# Patient Record
Sex: Female | Born: 1935
Health system: Southern US, Community
[De-identification: ages and names within clinical notes are randomized; demographics above are authoritative.]

## PROBLEM LIST (undated history)

## (undated) DIAGNOSIS — R42 Dizziness and giddiness: Secondary | ICD-10-CM

## (undated) DIAGNOSIS — C801 Malignant (primary) neoplasm, unspecified: Secondary | ICD-10-CM

## (undated) HISTORY — PX: SKIN BIOPSY: SHX1

## (undated) HISTORY — PX: BREAST BIOPSY: SHX20

---

## 2004-01-02 ENCOUNTER — Ambulatory Visit: Payer: Self-pay | Admitting: Internal Medicine

## 2005-02-16 ENCOUNTER — Ambulatory Visit: Payer: Self-pay | Admitting: Internal Medicine

## 2006-03-01 ENCOUNTER — Ambulatory Visit: Payer: Self-pay | Admitting: Internal Medicine

## 2006-03-04 ENCOUNTER — Ambulatory Visit: Payer: Self-pay | Admitting: Internal Medicine

## 2007-03-14 ENCOUNTER — Ambulatory Visit: Payer: Self-pay | Admitting: Internal Medicine

## 2008-04-26 ENCOUNTER — Ambulatory Visit: Payer: Self-pay | Admitting: Internal Medicine

## 2009-04-06 ENCOUNTER — Ambulatory Visit: Payer: Self-pay | Admitting: Otolaryngology

## 2009-04-29 ENCOUNTER — Ambulatory Visit: Payer: Self-pay | Admitting: Internal Medicine

## 2010-05-22 ENCOUNTER — Ambulatory Visit: Payer: Self-pay | Admitting: Internal Medicine

## 2010-12-06 ENCOUNTER — Ambulatory Visit: Payer: Self-pay | Admitting: Internal Medicine

## 2011-03-24 DIAGNOSIS — IMO0002 Reserved for concepts with insufficient information to code with codable children: Secondary | ICD-10-CM | POA: Insufficient documentation

## 2011-07-24 ENCOUNTER — Ambulatory Visit: Payer: Self-pay | Admitting: Internal Medicine

## 2012-08-31 ENCOUNTER — Ambulatory Visit: Payer: Self-pay | Admitting: Internal Medicine

## 2013-09-21 DIAGNOSIS — K279 Peptic ulcer, site unspecified, unspecified as acute or chronic, without hemorrhage or perforation: Secondary | ICD-10-CM | POA: Insufficient documentation

## 2013-09-21 DIAGNOSIS — K219 Gastro-esophageal reflux disease without esophagitis: Secondary | ICD-10-CM | POA: Insufficient documentation

## 2013-09-21 DIAGNOSIS — E785 Hyperlipidemia, unspecified: Secondary | ICD-10-CM | POA: Insufficient documentation

## 2013-09-29 ENCOUNTER — Ambulatory Visit: Payer: Self-pay | Admitting: Internal Medicine

## 2013-12-01 ENCOUNTER — Ambulatory Visit: Payer: Self-pay | Admitting: Physician Assistant

## 2013-12-06 ENCOUNTER — Encounter: Payer: Self-pay | Admitting: Surgery

## 2013-12-26 ENCOUNTER — Encounter: Payer: Self-pay | Admitting: Surgery

## 2013-12-26 LAB — WOUND CULTURE

## 2013-12-29 ENCOUNTER — Encounter: Payer: Self-pay | Admitting: General Surgery

## 2015-05-09 ENCOUNTER — Ambulatory Visit (INDEPENDENT_AMBULATORY_CARE_PROVIDER_SITE_OTHER): Payer: Medicare Other

## 2015-05-09 ENCOUNTER — Encounter: Payer: Self-pay | Admitting: *Deleted

## 2015-05-09 ENCOUNTER — Ambulatory Visit
Admission: EM | Admit: 2015-05-09 | Discharge: 2015-05-09 | Disposition: A | Discharge status: Home / Self Care | Payer: Medicare Other | Attending: Family Medicine | Admitting: Family Medicine

## 2015-05-09 DIAGNOSIS — S62609B Fracture of unspecified phalanx of unspecified finger, initial encounter for open fracture: Secondary | ICD-10-CM | POA: Diagnosis not present

## 2015-05-09 HISTORY — DX: Malignant (primary) neoplasm, unspecified: C80.1

## 2015-05-09 HISTORY — DX: Dizziness and giddiness: R42

## 2015-05-09 MED ORDER — SULFAMETHOXAZOLE-TRIMETHOPRIM 800-160 MG PO TABS: 1.0000 | ORAL_TABLET | Freq: Two times a day (BID) | ORAL | Status: DC

## 2015-05-09 MED ORDER — MUPIROCIN 2 % EX OINT: 1.0000 "application " | TOPICAL_OINTMENT | Freq: Three times a day (TID) | CUTANEOUS | Status: DC

## 2015-05-09 NOTE — ED Notes (Signed)
Patient was bringing in a trash can and fell in the driveway injuring her right thumb and bumping the left side of her face. Patient did experience nausea for a few minutes, but it went away. Patient does not have a history of falls.

## 2015-05-09 NOTE — Discharge Instructions (Signed)
Finger Fracture  Fractures of fingers are breaks in the bones of the fingers. There are many types of fractures. There are different ways of treating these fractures. Your health care provider will discuss the best way to treat your fracture.  CAUSES  Traumatic injury is the main cause of broken fingers. These include:  · Injuries while playing sports.  · Workplace injuries.  · Falls.  RISK FACTORS  Activities that can increase your risk of finger fractures include:  · Sports.  · Workplace activities that involve machinery.  · A condition called osteoporosis, which can make your bones less dense and cause them to fracture more easily.  SIGNS AND SYMPTOMS  The main symptoms of a broken finger are pain and swelling within 15 minutes after the injury. Other symptoms include:  · Bruising of your finger.  · Stiffness of your finger.  · Numbness of your finger.  · Exposed bones (compound fracture) if the fracture is severe.  DIAGNOSIS   The best way to diagnose a broken bone is with X-ray imaging. Additionally, your health care provider will use this X-ray image to evaluate the position of the broken finger bones.   TREATMENT   Finger fractures can be treated with:   · Nonreduction--This means the bones are in place. The finger is splinted without changing the positions of the bone pieces. The splint is usually left on for about a week to 10 days. This will depend on your fracture and what your health care provider thinks.  · Closed reduction--The bones are put back into position without using surgery. The finger is then splinted.  · Open reduction and internal fixation--The fracture site is opened. Then the bone pieces are fixed into place with pins or some type of hardware. This is seldom required. It depends on the severity of the fracture.  HOME CARE INSTRUCTIONS   · Follow your health care provider's instructions regarding activities, exercises, and physical therapy.  · Only take over-the-counter or prescription  medicines for pain, discomfort, or fever as directed by your health care provider.  SEEK MEDICAL CARE IF:  You have pain or swelling that limits the motion or use of your fingers.  SEEK IMMEDIATE MEDICAL CARE IF:   Your finger becomes numb.  MAKE SURE YOU:   · Understand these instructions.  · Will watch your condition.  · Will get help right away if you are not doing well or get worse.     This information is not intended to replace advice given to you by your health care provider. Make sure you discuss any questions you have with your health care provider.     Document Released: 04/26/2000 Document Revised: 11/02/2012 Document Reviewed: 08/24/2012  Elsevier Interactive Patient Education ©2016 [employer]

## 2015-05-09 NOTE — ED Provider Notes (Signed)
CSN: [id number]     Arrival date & time 05/09/15  1148 History   First MD Initiated Contact with Patient 05/09/15 1325     Chief Complaint  Patient presents with  . Finger Injury  . Facial Injury   (Consider location/radiation/quality/duration/timing/severity/associated sxs/prior Treatment) HPI  82 year old female who presents with an injury to her right thumb that occurred today when she was bringing in a trash can loss her balance and fell on a concrete pavement. Injured her right thumb and pain to left side of her face. She did not have any loss of consciousness and remembers the fall. She has not had any confusion or change in mental status according to her daughter. Does however have bruising of her right thumb swelling and tenderness particularly over the IP joint. There is a abrasion over the IP joint which extends into the dermis. Motion the IP joint is very painful.    Past Medical History  Diagnosis Date  . Vertigo   . Cancer [facility])     skin cancer on left cheek   Past Surgical History  Procedure Laterality Date  . Skin biopsy     History reviewed. No pertinent family history. Social History  Substance Use Topics  . Smoking status: Never Smoker   . Smokeless tobacco: Never Used  . Alcohol Use: No   OB History    No data available     Review of Systems  Constitutional: Positive for activity change. Negative for fever and chills.  Skin: Positive for color change and wound.  All other systems reviewed and are negative.   Allergies  Review of patient's allergies indicates no known allergies.  Home Medications   Prior to Admission medications   Medication Sig Start Date End Date Taking? Authorizing Provider  mupirocin ointment (BACTROBAN) 2 % Apply 1 application topically 3 (three) times daily. 05/09/15   Lorin Picket, PA-C  sulfamethoxazole-trimethoprim (BACTRIM DS,SEPTRA DS) 800-160 MG tablet Take 1 tablet by mouth 2 (two) times daily. 05/09/15   Lorin Picket, PA-C   Meds Ordered and Administered this Visit  Medications - No data to display  BP 154/80 mmHg  Pulse 73  Temp(Src) 97.8 F (36.6 C) (Oral)  Resp 18  Ht 5\' 2"  (1.575 m)  Wt 155 lb (70.308 kg)  BMI 28.34 kg/m2  SpO2 98% No data found.   Physical Exam  Constitutional: She appears well-developed and well-nourished. No distress.  HENT:  Head: Normocephalic and atraumatic.  Eyes: Conjunctivae are normal. Pupils are equal, round, and reactive to light.  Neck: Normal range of motion. Neck supple.  Musculoskeletal: She exhibits edema and tenderness.  Examination of the right dominant thumb shows a abrasion over the dorsum of the IP joint which extends into the dermis. There is swelling of the thumb with ecchymosis into the base of the thumb in the web space. There is tenderness over the IP joint and also of the distal proximal phalanx.  Examination of the face shows a small abrasion over the corner of her left eyebrow which is very small 1-1/2 mm in diameter. Is no tenderness of the facial bones of her bones malar bones.EOM are full and intact. Does not appear to be any globe injury.  Neurological: She is alert. She displays normal reflexes. No cranial nerve deficit. She exhibits normal muscle tone. Coordination normal.  Skin: Skin is warm and dry. She is not diaphoretic. There is erythema.  Psychiatric: She has a normal mood and affect. Her behavior  is normal. Judgment and thought content normal.  Nursing note and vitals reviewed.   ED Course  Procedures (including critical care time)  Labs Review Labs Reviewed - No data to display  Imaging Review Dg Finger Thumb Right  05/09/2015  CLINICAL DATA:  Right fell pain after fall.  Initial encounter. EXAM: RIGHT THUMB 2+V COMPARISON:  None. FINDINGS: Minimally displaced fracture is seen involving the first proximal phalanx. This appears to be closed and posttraumatic. Severe narrowing and osteophyte formation is seen involving  the first carpometacarpal joint. IMPRESSION: Minimally displaced first proximal phalangeal fracture. Osteoarthritis of the first carpometacarpal joint. Electronically Signed   By: Marijo Conception, M.D.   On: 05/09/2015 15:02     Visual Acuity Review  Right Eye Distance:   Left Eye Distance:   Bilateral Distance:    Right Eye Near:   Left Eye Near:    Bilateral Near:     Wound on the thumb was cleansed and dressed using a nonocclusive dressing and triple antibiotic. A dry sterile dressing was then applied. A radial gutter splint was also applied.    MDM   1. Open fracture of phalanx or phalanges of hand, initial encounter    Discharge Medication List as of 05/09/2015  3:54 PM    START taking these medications   Details  mupirocin ointment (BACTROBAN) 2 % Apply 1 application topically 3 (three) times daily., Starting 05/09/2015, Until Discontinued, Normal    sulfamethoxazole-trimethoprim (BACTRIM DS,SEPTRA DS) 800-160 MG tablet Take 1 tablet by mouth 2 (two) times daily., Starting 05/09/2015, Until Discontinued, Normal      Plan: 1. Test/x-ray results and diagnosis reviewed with patient 2. rx as per orders; risks, benefits, potential side effects reviewed with patient 3. Recommend supportive treatment with Ice and elevation. Is a possibility of an open fracture I would start her on Septra and wanted her to be seen by hand surgeon tomorrow or early next week. She may remove the radial gutter splint care but should not be aggressive in the use of her thumb.If she does remove the radial gutter splint she will clean the wound and apply Bactroban ointment S and reapply the splint. She notices any red streaks going up her arm has fever or is not feeling well she should go to emergency room. Also asked her daughter that  If she noticed any change in her personality or mental status that she should be seen immediately as well. They're going to make an appointment at [facility] with the hand  surgeon. 4. F/u prn if symptoms worsen or don't improve     Lorin Picket, PA-C 05/09/15 1545  Lorin Picket, PA-C 05/09/15 1601

## 2015-09-06 ENCOUNTER — Other Ambulatory Visit: Payer: Self-pay | Admitting: Internal Medicine

## 2015-09-06 DIAGNOSIS — Z1231 Encounter for screening mammogram for malignant neoplasm of breast: Secondary | ICD-10-CM

## 2015-09-13 ENCOUNTER — Ambulatory Visit
Admission: RE | Admit: 2015-09-13 | Discharge: 2015-09-13 | Disposition: A | Discharge status: Home / Self Care | Payer: Medicare Other | Source: Ambulatory Visit | Attending: Internal Medicine | Admitting: Internal Medicine

## 2015-09-13 ENCOUNTER — Other Ambulatory Visit: Payer: Self-pay | Admitting: Internal Medicine

## 2015-09-13 DIAGNOSIS — Z1231 Encounter for screening mammogram for malignant neoplasm of breast: Secondary | ICD-10-CM | POA: Diagnosis not present

## 2016-08-17 ENCOUNTER — Other Ambulatory Visit: Payer: Self-pay | Admitting: Internal Medicine

## 2016-08-25 ENCOUNTER — Other Ambulatory Visit: Payer: Self-pay | Admitting: Internal Medicine

## 2016-08-25 DIAGNOSIS — Z1231 Encounter for screening mammogram for malignant neoplasm of breast: Secondary | ICD-10-CM

## 2016-08-26 DIAGNOSIS — E2839 Other primary ovarian failure: Secondary | ICD-10-CM | POA: Insufficient documentation

## 2016-09-11 ENCOUNTER — Ambulatory Visit
Admission: RE | Admit: 2016-09-11 | Discharge: 2016-09-11 | Disposition: A | Discharge status: Home / Self Care | Payer: Medicare Other | Source: Ambulatory Visit | Attending: Internal Medicine | Admitting: Internal Medicine

## 2016-09-11 DIAGNOSIS — Z1231 Encounter for screening mammogram for malignant neoplasm of breast: Secondary | ICD-10-CM

## 2016-09-14 ENCOUNTER — Ambulatory Visit
Admission: RE | Admit: 2016-09-14 | Discharge: 2016-09-14 | Disposition: A | Discharge status: Home / Self Care | Payer: Medicare Other | Source: Ambulatory Visit | Attending: Internal Medicine | Admitting: Internal Medicine

## 2016-09-14 DIAGNOSIS — Z1231 Encounter for screening mammogram for malignant neoplasm of breast: Secondary | ICD-10-CM | POA: Insufficient documentation

## 2017-08-30 ENCOUNTER — Other Ambulatory Visit: Payer: Self-pay

## 2017-08-30 ENCOUNTER — Encounter: Payer: Self-pay | Admitting: Emergency Medicine

## 2017-08-30 ENCOUNTER — Ambulatory Visit (INDEPENDENT_AMBULATORY_CARE_PROVIDER_SITE_OTHER)
Admit: 2017-08-30 | Discharge: 2017-08-30 | Disposition: A | Discharge status: Home / Self Care | Payer: Medicare Other | Attending: Family Medicine | Admitting: Family Medicine

## 2017-08-30 ENCOUNTER — Ambulatory Visit
Admission: EM | Admit: 2017-08-30 | Discharge: 2017-08-30 | Disposition: A | Discharge status: Home / Self Care | Payer: Medicare Other | Attending: Family Medicine | Admitting: Family Medicine

## 2017-08-30 DIAGNOSIS — M79662 Pain in left lower leg: Secondary | ICD-10-CM | POA: Diagnosis not present

## 2017-08-30 DIAGNOSIS — L03116 Cellulitis of left lower limb: Secondary | ICD-10-CM | POA: Diagnosis not present

## 2017-08-30 DIAGNOSIS — S81802D Unspecified open wound, left lower leg, subsequent encounter: Secondary | ICD-10-CM | POA: Diagnosis not present

## 2017-08-30 DIAGNOSIS — Z23 Encounter for immunization: Secondary | ICD-10-CM

## 2017-08-30 MED ORDER — CEPHALEXIN 500 MG PO CAPS: 500.0000 mg | ORAL_CAPSULE | Freq: Four times a day (QID) | ORAL | 0 refills | Status: AC

## 2017-08-30 MED ORDER — TETANUS-DIPHTH-ACELL PERTUSSIS 5-2.5-18.5 LF-MCG/0.5 IM SUSP
0.5000 mL | Freq: Once | INTRAMUSCULAR | Status: AC
  Administered 2017-08-30: 0.5 mL via INTRAMUSCULAR

## 2017-08-30 MED ORDER — MUPIROCIN 2 % EX OINT: TOPICAL_OINTMENT | CUTANEOUS | 0 refills | Status: DC

## 2017-08-30 NOTE — ED Triage Notes (Signed)
Patient states that she hit her left lower leg on a wire basket about a week ago.  Patient was seen at Urgent Care in [place] on Monday.  Patient here for follow-up regarding her skin tear.

## 2017-08-30 NOTE — ED Provider Notes (Signed)
[facility] ____________________________________________  Time seen: Approximately 9:50 AM  I have reviewed the triage vital signs and the nursing notes.   HISTORY  Chief Complaint Leg Pain (left)   HPI Brooke Moyer is a 84 y.o. female presenting with daughter at bedside for evaluation of left leg wound that occurred just over 1 week ago.  Reports last Sunday she was walking to the house and excellently hit the end of a week or basket which caused a skin tear to left leg.  States that she went to a urgent care in [place] last Monday, and states that that time they clean the area but otherwise no intervention occurred.  States was not placed on oral antibiotics.  States over the last week the area has seemed to become somewhat red around the area as well as with some tenderness.  States initially after the injury she denies pain.  Has continued to remain ambulating.  Denies any concern of bony trauma.  Unsure of last tetanus immunization.  Other than keeping clean, no alleviating measures attempted.  Denies aggravating factors.  Denies fevers, pain radiation, difficulty walking or other complaints.  Reports otherwise feels well.  Denies recent sickness. Denies recent antibiotic use.   [facility]P: [facility]   Past Medical History:  Diagnosis Date  . Cancer ([place])    skin cancer on left cheek  . Vertigo     There are no active problems to display for this patient.   Past Surgical History:  Procedure Laterality Date  . BREAST BIOPSY Left years ago   cyst  . SKIN BIOPSY       No current facility-administered medications for this encounter.   Current Outpatient Medications:  .  cephALEXin (KEFLEX) 500 MG capsule, Take 1 capsule (500 mg total) by mouth 4 (four) times daily for 10 days., Disp: 40 capsule, Rfl: 0 .  mupirocin ointment (BACTROBAN) 2 %, Apply two times a day for 10 days., Disp: 22 g, Rfl: 0  Allergies Patient has no known allergies.  Family  History  Problem Relation Age of Onset  . Breast cancer Sister 43    Social History Social History   Tobacco Use  . Smoking status: Never Smoker  . Smokeless tobacco: Never Used  Substance Use Topics  . Alcohol use: No  . Drug use: No    Review of Systems Constitutional: No fever/chills Cardiovascular: Denies chest pain. Respiratory: Denies shortness of breath. Gastrointestinal: No abdominal pain.   Musculoskeletal: Negative for back pain. Skin: as above.   ____________________________________________   PHYSICAL EXAM:  VITAL SIGNS: ED Triage Vitals  Enc Vitals Group     BP 08/30/17 0845 (!) 142/90     Pulse Rate 08/30/17 0845 71     Resp 08/30/17 0845 16     Temp 08/30/17 0845 97.9 F (36.6 C)     Temp Source 08/30/17 0845 Oral     SpO2 08/30/17 0845 100 %     Weight 08/30/17 0841 153 lb (69.4 kg)     Height 08/30/17 0841 5\' 2"  (1.575 m)     Head Circumference --      Peak Flow --      Pain Score 08/30/17 0840 2     Pain Loc --      Pain Edu? --      Excl. in [place]? --     Constitutional: Alert and oriented. Well appearing and in no acute distress. ENT      Head: Normocephalic and  atraumatic. Cardiovascular: Normal rate, regular rhythm. Grossly normal heart sounds.  Good peripheral circulation. Respiratory: Normal respiratory effort without tachypnea nor retractions. Breath sounds are clear and equal bilaterally. No wheezes, rales, rhonchi. Musculoskeletal:  Bilateral posterior tibialis and dorsalis pedis pulses equal and easily palpated. Neurologic:  Normal speech and language. Speech is normal. No gait instability.  Skin:  Skin is warm, dry. Except:    As depicted above, healing large skin tear with a slight purulent drainage and mild surrounding erythema, mild tenderness to direct soft tissue as well as posterior calf, erythema is noncircumferential, no pain with plantarflexion and dorsiflexion, no point bony tenderness, normal distal  sensation.  Psychiatric: Mood and affect are normal. Speech and behavior are normal. Patient exhibits appropriate insight and judgment   ___________________________________________   LABS (all labs ordered are listed, but only abnormal results are displayed)  Labs Reviewed  AEROBIC CULTURE (SUPERFICIAL SPECIMEN)    RADIOLOGY  US Venous Img Lower Unilateral Left  Result Date: 08/30/2017 CLINICAL DATA:  Left lower extremity wound for the past week. Evaluate for DVT. EXAM: LEFT LOWER EXTREMITY VENOUS DOPPLER ULTRASOUND TECHNIQUE: Gray-scale sonography with graded compression, as well as color Doppler and duplex ultrasound were performed to evaluate the lower extremity deep venous systems from the level of the common femoral vein and including the common femoral, femoral, profunda femoral, popliteal and calf veins including the posterior tibial, peroneal and gastrocnemius veins when visible. The superficial great saphenous vein was also interrogated. Spectral Doppler was utilized to evaluate flow at rest and with distal augmentation maneuvers in the common femoral, femoral and popliteal veins. COMPARISON:  None. FINDINGS: Contralateral Common Femoral Vein: Respiratory phasicity is normal and symmetric with the symptomatic side. No evidence of thrombus. Normal compressibility. Common Femoral Vein: No evidence of thrombus. Normal compressibility, respiratory phasicity and response to augmentation. Saphenofemoral Junction: No evidence of thrombus. Normal compressibility and flow on color Doppler imaging. Profunda Femoral Vein: No evidence of thrombus. Normal compressibility and flow on color Doppler imaging. Femoral Vein: No evidence of thrombus. Normal compressibility, respiratory phasicity and response to augmentation. Popliteal Vein: No evidence of thrombus. Normal compressibility, respiratory phasicity and response to augmentation. Calf Veins: No evidence of thrombus. Normal compressibility and flow on  color Doppler imaging. Superficial Great Saphenous Vein: No evidence of thrombus. Normal compressibility. Venous Reflux:  None. Other Findings: There is a minimal amount serpiginous anechoic fluid within the subdermal tissues which correlates with the patient's wound involving the anterior aspect the lower leg. The collection measures at least 5.3 x 0.3 cm. IMPRESSION: 1. No evidence of DVT within the left lower extremity. 2. Serpiginous at least 5.3 cm subdermal fluid correlates with the patient's wound involving the anterior aspect of the lower leg. Electronically Signed   By: Sandi Mariscal M.D.   On: 08/30/2017 10:23   ____________________________________________   PROCEDURES Procedures   INITIAL IMPRESSION / ASSESSMENT AND PLAN / ED COURSE  Pertinent labs & imaging results that were available during my care of the patient were reviewed by me and considered in my medical decision making (see chart for details).  Well-appearing patient.  No acute distress.  Patient with left lower leg wound from 1 week prior after bumping into a basket.  Tetanus immunization updated.  Patient reports no pain initially denies concern for bony trauma, will defer x-ray.  Ultrasound results as above per radiologist, no evidence of DVT.  Will start patient on oral Keflex, topical Bactroban.  Encourage wound care and elevation.  Encourage  follow-up in approximately 4 days for wound check.  Discussed strict follow-up and return parameters.  Wound culture obtained.Discussed indication, risks and benefits of medications with patient. Information also given for [facility] wound clinic.   Discussed follow up with Primary care physician this week. Discussed follow up and return parameters including no resolution or any worsening concerns. Patient verbalized understanding and agreed to plan.   ____________________________________________   FINAL CLINICAL IMPRESSION(S) / ED DIAGNOSES  Final diagnoses:  Cellulitis of leg, left   Leg wound, left, subsequent encounter     ED Discharge Orders        Ordered    cephALEXin (KEFLEX) 500 MG capsule  4 times daily     08/30/17 1030    mupirocin ointment (BACTROBAN) 2 %     08/30/17 1030       Note: This dictation was prepared with Dragon dictation along with smaller phrase technology. Any transcriptional errors that result from this process are unintentional.         Marylene Land, NP 08/30/17 1130

## 2017-08-30 NOTE — Discharge Instructions (Addendum)
Take medication as prescribed. Elevate. Keep clean.   Follow up with your primary care physician or the above this week for follow up.    Return to Urgent care for new or worsening concerns.

## 2017-09-02 LAB — AEROBIC CULTURE  (SUPERFICIAL SPECIMEN)

## 2017-09-03 ENCOUNTER — Other Ambulatory Visit: Payer: Self-pay

## 2017-09-03 ENCOUNTER — Ambulatory Visit
Admission: EM | Admit: 2017-09-03 | Discharge: 2017-09-03 | Disposition: A | Discharge status: Home / Self Care | Payer: Medicare Other | Attending: Family Medicine | Admitting: Family Medicine

## 2017-09-03 DIAGNOSIS — L03116 Cellulitis of left lower limb: Secondary | ICD-10-CM | POA: Diagnosis not present

## 2017-09-03 DIAGNOSIS — S81802D Unspecified open wound, left lower leg, subsequent encounter: Secondary | ICD-10-CM

## 2017-09-03 NOTE — Discharge Instructions (Addendum)
Keep your appointment for next week.   Continue oral antibiotic as prescribed. Use antibiotic ointment once a day as discussed, apply at 2 PM.  If you are at work please cover the wound.  If you are at home allow open to air time to help scab over.  Continue to monitor closely.  Return to urgent care as needed.

## 2017-09-03 NOTE — ED Triage Notes (Signed)
Patient is here for recheck on left leg wound. Patient was seen on Monday.

## 2017-09-03 NOTE — ED Provider Notes (Signed)
[facility] ____________________________________________  Time seen: Approximately 9:23 AM  I have reviewed the triage vital signs and the nursing notes.   HISTORY  Chief Complaint Wound Check   HPI Brooke Moyer is a 84 y.o. female presenting with daughter at bedside for reevaluation of left anterior leg wound.  Reports was seen in urgent care this past Monday for the same.  Tetanus immunization was up-to-date.  States wound originally came from her accidentally bumping into a week or basket causing a large skin tear.  Was placed on Keflex as well as oral Bactroban this past Monday which she has been taken as directed.  States that she has been keeping area covered most of the day.  States that she feels that the redness and drainage has improved as well as her tenderness.  States minimal tenderness now only at the wound site itself.  Denies fevers.  Denies other pain, paresthesias, pain radiation or other complaints.  Patient states overall she feels better.  Also had a negative left leg ultrasound DVT study on Monday.  States that she does have an appointment next Friday with her primary care for wound check follow-up.  Denies other aggravating or alleviating factors.  Has been cleaning with soap and water.  Reports otherwise feels well.   PCP: [facility]  Past Medical History:  Diagnosis Date  . Cancer ([place])    skin cancer on left cheek  . Vertigo     There are no active problems to display for this patient.   Past Surgical History:  Procedure Laterality Date  . BREAST BIOPSY Left years ago   cyst  . SKIN BIOPSY       No current facility-administered medications for this encounter.   Current Outpatient Medications:  .  cephALEXin (KEFLEX) 500 MG capsule, Take 1 capsule (500 mg total) by mouth 4 (four) times daily for 10 days., Disp: 40 capsule, Rfl: 0 .  mupirocin ointment (BACTROBAN) 2 %, Apply two times a day for 10 days., Disp: 22 g, Rfl:  0  Allergies Patient has no known allergies.  Family History  Problem Relation Age of Onset  . Breast cancer Sister 49    Social History Social History   Tobacco Use  . Smoking status: Never Smoker  . Smokeless tobacco: Never Used  Substance Use Topics  . Alcohol use: No  . Drug use: No    Review of Systems Constitutional: No fever/chills Cardiovascular: Denies chest pain. Respiratory: Denies shortness of breath. Gastrointestinal: No abdominal pain.   Musculoskeletal: Negative for back pain. Skin: As above.  ____________________________________________   PHYSICAL EXAM:  VITAL SIGNS: ED Triage Vitals  Enc Vitals Group     BP 09/03/17 0821 (!) 143/98     Pulse Rate 09/03/17 0821 61     Resp 09/03/17 0821 17     Temp 09/03/17 0821 97.8 F (36.6 C)     Temp Source 09/03/17 0821 Oral     SpO2 09/03/17 0821 100 %     Weight 09/03/17 0819 153 lb (69.4 kg)     Height 09/03/17 0819 5\' 2"  (1.575 m)     Head Circumference --      Peak Flow --      Pain Score 09/03/17 0819 3     Pain Loc --      Pain Edu? --      Excl. in [place]? --     Constitutional: Alert and oriented. Well appearing and in no acute distress.  ENT      Head: Normocephalic and atraumatic. Cardiovascular: Normal rate, regular rhythm. Grossly normal heart sounds.  Good peripheral circulation. Respiratory: Normal respiratory effort without tachypnea nor retractions. Breath sounds are clear and equal bilaterally. No wheezes, rales, rhonchi. Musculoskeletal:  Steady gait. Bilateral pedal pulses equal and easily palpated. Neurologic:  Normal speech and language. Speech is normal. No gait instability.  Skin:  Skin is warm, dry.  Except:     Left anterior leg with above wound, beefy wound base with some spots of granulation tissue, no purulence, no fluctuance, mild immediate erythema around margins and minimal tenderness, left lower extremity otherwise nontender and no calf tenderness, steady  gait. Psychiatric: Mood and affect are normal. Speech and behavior are normal. Patient exhibits appropriate insight and judgment   ___________________________________________   LABS (all labs ordered are listed, but only abnormal results are displayed)  Labs Reviewed - No data to display ____________________________________________  PROCEDURES Procedures    INITIAL IMPRESSION / ASSESSMENT AND PLAN / ED COURSE  Pertinent labs & imaging results that were available during my care of the patient were reviewed by me and considered in my medical decision making (see chart for details).  Well-appearing patient.  No acute distress.  Wound as depicted above.  Outer layer of skin did slough off per patient and image, wound appears to be healing, decreased erythema and drainage.  Cellulitis appears to be improving but continues with open wound.  Suspect patient has been having covered most of the time and patient does report this, discussed to allow open air time to help dry.  Continue oral antibiotic.  Bactroban once daily.  Continue cleaning with soap and water and keep follow-up appointment next week.  Discussed monitoring and return to urgent care as needed.  Discussed follow up with Primary care physician this week. Discussed follow up and return parameters including no resolution or any worsening concerns. Patient verbalized understanding and agreed to plan.   ____________________________________________   FINAL CLINICAL IMPRESSION(S) / ED DIAGNOSES  Final diagnoses:  Cellulitis of leg, left  Leg wound, left, subsequent encounter     ED Discharge Orders    None       Note: This dictation was prepared with Dragon dictation along with smaller phrase technology. Any transcriptional errors that result from this process are unintentional.         Marylene Land, NP 09/03/17 [phone]

## 2017-12-07 ENCOUNTER — Other Ambulatory Visit: Payer: Self-pay | Admitting: Internal Medicine

## 2017-12-07 DIAGNOSIS — Z1231 Encounter for screening mammogram for malignant neoplasm of breast: Secondary | ICD-10-CM

## 2018-01-04 ENCOUNTER — Ambulatory Visit
Admission: RE | Admit: 2018-01-04 | Discharge: 2018-01-04 | Disposition: A | Discharge status: Home / Self Care | Payer: Medicare Other | Source: Ambulatory Visit | Attending: Internal Medicine | Admitting: Internal Medicine

## 2018-01-04 DIAGNOSIS — Z1231 Encounter for screening mammogram for malignant neoplasm of breast: Secondary | ICD-10-CM | POA: Insufficient documentation

## 2018-11-03 DIAGNOSIS — Z7189 Other specified counseling: Secondary | ICD-10-CM | POA: Insufficient documentation

## 2018-11-03 DIAGNOSIS — H9110 Presbycusis, unspecified ear: Secondary | ICD-10-CM | POA: Insufficient documentation

## 2019-01-04 DIAGNOSIS — R634 Abnormal weight loss: Secondary | ICD-10-CM | POA: Insufficient documentation

## 2019-03-26 ENCOUNTER — Ambulatory Visit: Payer: Medicare Other | Attending: Internal Medicine

## 2019-03-26 DIAGNOSIS — Z23 Encounter for immunization: Secondary | ICD-10-CM

## 2019-03-26 NOTE — Progress Notes (Signed)
   Covid-19 Vaccination Clinic  Name:  Brooke Moyer    MRN: [id number] DOB: [birth year]  03/26/2019  [name] was observed post Covid-19 immunization for 15 minutes without incidence. She was provided with Vaccine Information Sheet and instruction to access the V-Safe system.   [name] was instructed to call 911 with any severe reactions post vaccine: Marland Kitchen Difficulty breathing  . Swelling of your face and throat  . A fast heartbeat  . A bad rash all over your body  . Dizziness and weakness    Immunizations Administered    Name Date Dose VIS Date Route   Pfizer COVID-19 Vaccine 03/26/2019  8:36 AM 0.3 mL 01/06/2019 Intramuscular   Manufacturer: [place]   Lot: [id number]   [place]: [id number]

## 2019-04-17 ENCOUNTER — Ambulatory Visit: Payer: Medicare Other | Attending: Internal Medicine

## 2019-04-17 DIAGNOSIS — Z23 Encounter for immunization: Secondary | ICD-10-CM

## 2019-04-17 NOTE — Progress Notes (Signed)
   Covid-19 Vaccination Clinic  Name:  Brooke Moyer    MRN: [id number] DOB: [birth year]  04/17/2019  Brooke Moyer was observed post Covid-19 immunization for 15 minutes without incident. She was provided with Vaccine Information Sheet and instruction to access the V-Safe system.   Brooke Moyer was instructed to call 911 with any severe reactions post vaccine: Marland Kitchen Difficulty breathing  . Swelling of face and throat  . A fast heartbeat  . A bad rash all over body  . Dizziness and weakness   Immunizations Administered    Name Date Dose VIS Date Route   Pfizer COVID-19 Vaccine 04/17/2019  8:22 AM 0.3 mL 01/06/2019 Intramuscular   Manufacturer: [place]   Lot: [id number]   [place]: [id number]

## 2019-05-11 DIAGNOSIS — R1310 Dysphagia, unspecified: Secondary | ICD-10-CM | POA: Insufficient documentation

## 2019-05-11 DIAGNOSIS — R002 Palpitations: Secondary | ICD-10-CM | POA: Insufficient documentation

## 2019-10-03 DIAGNOSIS — K5900 Constipation, unspecified: Secondary | ICD-10-CM | POA: Insufficient documentation

## 2019-11-27 ENCOUNTER — Ambulatory Visit: Payer: Medicare Other

## 2019-11-27 ENCOUNTER — Ambulatory Visit: Payer: Medicare Other | Attending: Internal Medicine

## 2019-11-27 DIAGNOSIS — Z23 Encounter for immunization: Secondary | ICD-10-CM

## 2019-11-27 NOTE — Progress Notes (Signed)
   Covid-19 Vaccination Clinic  Name:  Brooke Moyer    MRN: [id number] DOB: [birth year]  11/27/2019  [name] was observed post Covid-19 immunization for 15 minutes without incident. She was provided with Vaccine Information Sheet and instruction to access the V-Safe system.   [name] was instructed to call 911 with any severe reactions post vaccine: Marland Kitchen Difficulty breathing  . Swelling of face and throat  . A fast heartbeat  . A bad rash all over body  . Dizziness and weakness

## 2020-03-04 ENCOUNTER — Ambulatory Visit
Admission: EM | Admit: 2020-03-04 | Discharge: 2020-03-04 | Disposition: A | Discharge status: Home / Self Care | Payer: Medicare Other | Attending: Family Medicine | Admitting: Family Medicine

## 2020-03-04 ENCOUNTER — Ambulatory Visit: Payer: Self-pay

## 2020-03-04 ENCOUNTER — Other Ambulatory Visit: Payer: Self-pay

## 2020-03-04 DIAGNOSIS — M5441 Lumbago with sciatica, right side: Secondary | ICD-10-CM | POA: Diagnosis not present

## 2020-03-04 MED ORDER — PREDNISONE 10 MG (21) PO TBPK: ORAL_TABLET | ORAL | 0 refills | Status: DC

## 2020-03-04 NOTE — ED Triage Notes (Signed)
Patient complains of right buttocks pain without known injury. States that pain has been ongoing for 1 week and worse when getting up.

## 2020-03-04 NOTE — Discharge Instructions (Addendum)
Medication as prescribed.    If persists, please let me know.  Take care  Dr. Lacinda Axon

## 2020-03-04 NOTE — ED Provider Notes (Signed)
[facility]    CSN: [id number] Arrival date & time: 03/04/20  1446      History   Chief Complaint Chief Complaint  Patient presents with  . Hip Pain   HPI  87 year old female presents with the above complaint.  Patient reports that she is having pain in her right buttocks.  Started approximately 1 week ago after she was dancing.  She has taken some over-the-counter ibuprofen without resolution.  Seems to bother her with activities and with sitting.  No reports of weakness.  No saddle anesthesia or incontinence.  No groin pain.  No other complaints.  Past Medical History:  Diagnosis Date  . Cancer ([place])    skin cancer on left cheek  . Vertigo    Past Surgical History:  Procedure Laterality Date  . BREAST BIOPSY Left years ago   cyst  . SKIN BIOPSY      OB History   No obstetric history on file.      Home Medications    Prior to Admission medications   Medication Sig Start Date End Date Taking? Authorizing Provider  cyanocobalamin 1000 MCG tablet Take by mouth.   Yes [provider]  Multiple Vitamins-Minerals (PRESERVISION AREDS 2 PO) Take by mouth.   Yes [provider]  predniSONE (STERAPRED UNI-PAK 21 TAB) 10 MG (21) TBPK tablet 6 tablets on day 1; decrease by 1 tablet daily until gone. 03/04/20  Yes Laini Urick G, DO  senna (SENOKOT) 8.6 MG tablet Take by mouth.   Yes [provider]    Family History Family History  Problem Relation Age of Onset  . Breast cancer Sister 28    Social History Social History   Tobacco Use  . Smoking status: Never Smoker  . Smokeless tobacco: Never Used  Vaping Use  . Vaping Use: Never used  Substance Use Topics  . Alcohol use: No  . Drug use: No     Allergies   Patient has no known allergies.   Review of Systems Review of Systems Per HPI  Physical Exam Triage Vital Signs ED Triage Vitals  Enc Vitals Group     BP 03/04/20 1511 (!) 167/78     Pulse Rate 03/04/20 1511  76     Resp 03/04/20 1511 18     Temp 03/04/20 1511 98.1 F (36.7 C)     Temp Source 03/04/20 1511 Oral     SpO2 03/04/20 1511 99 %     Weight 03/04/20 1508 122 lb (55.3 kg)     Height 03/04/20 1508 5\' 2"  (1.575 m)     Head Circumference --      Peak Flow --      Pain Score 03/04/20 1508 10     Pain Loc --      Pain Edu? --      Excl. in [place]? --    Updated Vital Signs BP (!) 167/78 (BP Location: Left Arm)   Pulse 76   Temp 98.1 F (36.7 C) (Oral)   Resp 18   Ht 5\' 2"  (1.575 m)   Wt 55.3 kg   SpO2 99%   BMI 22.31 kg/m   Visual Acuity Right Eye Distance:   Left Eye Distance:   Bilateral Distance:    Right Eye Near:   Left Eye Near:    Bilateral Near:     Physical Exam Vitals and nursing note reviewed.  Constitutional:      General: She is not in acute distress.  Appearance: Normal appearance. She is not ill-appearing.  HENT:     Head: Normocephalic and atraumatic.  Eyes:     General:        Right eye: No discharge.        Left eye: No discharge.     Conjunctiva/sclera: Conjunctivae normal.  Cardiovascular:     Rate and Rhythm: Normal rate and regular rhythm.  Pulmonary:     Effort: Pulmonary effort is normal.     Breath sounds: Normal breath sounds.  Musculoskeletal:     Comments: Right buttock with tenderness to palpation over the piriformis.  Neurological:     Mental Status: She is alert.  Psychiatric:        Mood and Affect: Mood normal.        Behavior: Behavior normal.    UC Treatments / Results  Labs (all labs ordered are listed, but only abnormal results are displayed) Labs Reviewed - No data to display  EKG   Radiology No results found.  Procedures Procedures (including critical care time)  Medications Ordered in UC Medications - No data to display  Initial Impression / Assessment and Plan / UC Course  I have reviewed the triage vital signs and the nursing notes.  Pertinent labs & imaging results that were available during my  care of the patient were reviewed by me and considered in my medical decision making (see chart for details).    87 year old female presents with low back pain versus performance syndrome.  I suspect that this is coming from her low back.  Prednisone as prescribed.  Supportive care.  Final Clinical Impressions(s) / UC Diagnoses   Final diagnoses:  Acute right-sided low back pain with right-sided sciatica     Discharge Instructions     Medication as prescribed.    If persists, please let me know.  Take care  Dr. Lacinda Axon    ED Prescriptions    Medication Sig Dispense Auth. Provider   predniSONE (STERAPRED UNI-PAK 21 TAB) 10 MG (21) TBPK tablet 6 tablets on day 1; decrease by 1 tablet daily until gone. 21 tablet Thersa Salt G, DO     PDMP not reviewed this encounter.   Coral Spikes, [place] 03/04/20 1626

## 2020-03-12 DIAGNOSIS — I159 Secondary hypertension, unspecified: Secondary | ICD-10-CM | POA: Insufficient documentation

## 2020-03-12 DIAGNOSIS — G5701 Lesion of sciatic nerve, right lower limb: Secondary | ICD-10-CM | POA: Insufficient documentation

## 2020-03-12 DIAGNOSIS — W19XXXA Unspecified fall, initial encounter: Secondary | ICD-10-CM

## 2020-03-25 ENCOUNTER — Other Ambulatory Visit: Payer: Self-pay

## 2020-03-25 ENCOUNTER — Ambulatory Visit
Admission: RE | Admit: 2020-03-25 | Discharge: 2020-03-25 | Disposition: A | Discharge status: Home / Self Care | Payer: Medicare Other | Source: Ambulatory Visit | Attending: Physician Assistant | Admitting: Physician Assistant

## 2020-03-25 VITALS — BP 141/79 | HR 72 | Temp 98.2°F | Resp 18 | Ht 62.0 in | Wt 122.0 lb

## 2020-03-25 DIAGNOSIS — M5442 Lumbago with sciatica, left side: Secondary | ICD-10-CM

## 2020-03-25 DIAGNOSIS — M7918 Myalgia, other site: Secondary | ICD-10-CM | POA: Diagnosis not present

## 2020-03-25 MED ORDER — KETOROLAC TROMETHAMINE 60 MG/2ML IM SOLN
30.0000 mg | Freq: Once | INTRAMUSCULAR | Status: AC
  Administered 2020-03-25: 30 mg via INTRAMUSCULAR

## 2020-03-25 MED ORDER — CELECOXIB 100 MG PO CAPS
100.0000 mg | ORAL_CAPSULE | Freq: Two times a day (BID) | ORAL | 0 refills | Status: AC
Start: 2020-03-25 — End: 2020-04-09

## 2020-03-25 NOTE — ED Provider Notes (Signed)
[facility]    CSN: [id number] Arrival date & time: 03/25/20  1350      History   Chief Complaint Chief Complaint  Patient presents with  . Appointment  . Hip Pain    left    HPI Brooke Moyer is a 87 y.o. female with history of dementia.  Patient is presenting with her daughter who helps to provide some of the history today.  Patient was seen in [place] urgent care 3 weeks ago for right-sided buttocks pain and back pain.  Patient improved with prednisone and stretches.  Over the past 2 days she has developed a similar pain of the opposite side.  She denies ever falling or injuring her back.  She is complaining of bilateral lower back pain and pain in her left buttocks region radiating to the back of her left thigh.  She admits to increased pain with bearing weight on the left side.  She is walking with a bit of a limp today.  Has increased pain with crossing her leg and was sitting for long periods of time.  Pain slightly improved by Tylenol and rest.  She denies any leg weakness, loss of bowel or bladder control, or saddle anesthesia.  Patient has no history of significant back problems.  She has not seen orthopedics regarding this issue, but has been to orthopedics in the past and believes she does have an orthopedist.  She has no other concerns or complaints today.  HPI  Past Medical History:  Diagnosis Date  . Cancer ([place])    skin cancer on left cheek  . Vertigo     There are no problems to display for this patient.   Past Surgical History:  Procedure Laterality Date  . BREAST BIOPSY Left years ago   cyst  . SKIN BIOPSY      OB History   No obstetric history on file.      Home Medications    Prior to Admission medications   Medication Sig Start Date End Date Taking? Authorizing Provider  celecoxib (CELEBREX) 100 MG capsule Take 1 capsule (100 mg total) by mouth 2 (two) times daily for 15 days. 03/25/20 04/09/20 Yes Danton Clap, PA-C   cyanocobalamin 1000 MCG tablet Take by mouth.   Yes [provider]  Multiple Vitamins-Minerals (PRESERVISION AREDS 2 PO) Take by mouth.   Yes [provider]  senna (SENOKOT) 8.6 MG tablet Take by mouth.   Yes [provider]  predniSONE (STERAPRED UNI-PAK 21 TAB) 10 MG (21) TBPK tablet 6 tablets on day 1; decrease by 1 tablet daily until gone. 03/04/20   Coral Spikes, DO    Family History Family History  Problem Relation Age of Onset  . Breast cancer Sister 98  . Macular degeneration Mother   . Other Father        unknown medical history    Social History Social History   Tobacco Use  . Smoking status: Never Smoker  . Smokeless tobacco: Never Used  Vaping Use  . Vaping Use: Never used  Substance Use Topics  . Alcohol use: No  . Drug use: No     Allergies   Codeine   Review of Systems Review of Systems  Constitutional: Negative for fatigue and fever.  Gastrointestinal: Negative for abdominal pain, nausea and vomiting.  Genitourinary: Negative for difficulty urinating, dysuria and flank pain.  Musculoskeletal: Positive for back pain and gait problem.  Skin: Negative for rash and wound.  Neurological: Negative for weakness and numbness.     Physical Exam Triage Vital Signs ED Triage Vitals  Enc Vitals Group     BP 03/25/20 1427 (!) 141/79     Pulse Rate 03/25/20 1427 72     Resp 03/25/20 1427 18     Temp 03/25/20 1427 98.2 F (36.8 C)     Temp Source 03/25/20 1427 Oral     SpO2 03/25/20 1427 98 %     Weight 03/25/20 1423 122 lb (55.3 kg)     Height 03/25/20 1423 5\' 2"  (1.575 m)     Head Circumference --      Peak Flow --      Pain Score 03/25/20 1423 8     Pain Loc --      Pain Edu? --      Excl. in [place]? --    No data found.  Updated Vital Signs BP (!) 141/79 (BP Location: Right Arm)   Pulse 72   Temp 98.2 F (36.8 C) (Oral)   Resp 18   Ht 5\' 2"  (1.575 m)   Wt 122 lb (55.3 kg)   SpO2 98%   BMI 22.31 kg/m         Physical Exam Vitals and nursing note reviewed.  Constitutional:      General: She is not in acute distress.    Appearance: Normal appearance. She is not ill-appearing or toxic-appearing.  HENT:     Head: Normocephalic and atraumatic.  Eyes:     General: No scleral icterus.       Right eye: No discharge.        Left eye: No discharge.     Conjunctiva/sclera: Conjunctivae normal.  Cardiovascular:     Rate and Rhythm: Normal rate and regular rhythm.     Heart sounds: Normal heart sounds.  Pulmonary:     Effort: Pulmonary effort is normal. No respiratory distress.     Breath sounds: Normal breath sounds.  Musculoskeletal:     Cervical back: Neck supple.     Lumbar back: Tenderness (diffuse TTP throughout lower lumbar region bilaterally) present. Normal range of motion. Positive left straight leg raise test. Negative right straight leg raise test.     Left hip: Tenderness: TTP diffusely throughout left buttocks, but worse around the piriformis muscle.  Skin:    General: Skin is dry.  Neurological:     General: No focal deficit present.     Mental Status: She is alert. Mental status is at baseline.     Motor: No weakness.     Gait: Gait abnormal (guarding on the left).  Psychiatric:        Mood and Affect: Mood normal.        Behavior: Behavior normal.        Thought Content: Thought content normal.      UC Treatments / Results  Labs (all labs ordered are listed, but only abnormal results are displayed) Labs Reviewed - No data to display  EKG   Radiology No results found.  Procedures Procedures (including critical care time)  Medications Ordered in UC Medications  ketorolac (TORADOL) injection 30 mg (30 mg Intramuscular Given 03/25/20 1545)    Initial Impression / Assessment and Plan / UC Course  I have reviewed the triage vital signs and the nursing notes.  Pertinent labs & imaging results that were available during my care of the patient were reviewed by me  and considered in my medical decision making (see  chart for details).   87 year old female presenting for lower back pain off and on x3 weeks.  Over the past 2 days she has developed pain of the left buttocks and down the back of the left thigh.  In a similar pain a couple weeks ago but on the opposite side.  Has tried Tylenol and heat with minimal improvement in symptoms.  Patient seen in the clinic 3 weeks ago and given prednisone and advised to follow-up with PCP or Ortho.  Patient saw Ortho a week after that and was advised on how to perform piriformis stretches and to return if not improving.  Patient perform the stretches and improved.  Her symptoms today are consistent with lower back pain and lumbar radiculopathy as well as piriformis muscle pain.  Advised stretches, heat, Tylenol for pain and I prescribed celecoxib.  Patient given 30 mg IM ketorolac in the clinic.  She says she does believe she has an orthopedist so advised her to call the orthopedist since this has been going on for close to a month now.  Advised she may need imaging of her back.  ED precautions for back pain reviewed with patient and daughter.  Final Clinical Impressions(s) / UC Diagnoses   Final diagnoses:  Acute bilateral low back pain with left-sided sciatica  Piriformis muscle pain     Discharge Instructions     BACK PAIN: Stressed avoiding painful activities . RICE (REST, ICE, COMPRESSION, ELEVATION) guidelines reviewed. May alternate ice and heat. Consider use of muscle rubs, Salonpas patches, etc. Use medications as directed including muscle relaxers if prescribed. Take anti-inflammatory medications as prescribed or OTC NSAIDs/Tylenol.  F/u with PCP in 7-10 days for reexamination, and please feel free to call or return to the urgent care at any time for any questions or concerns you may have and we will be happy to help you!   BACK PAIN RED FLAGS: If the back pain acutely worsens or there are any red flag symptoms  such as numbness/tingling, leg weakness, saddle anesthesia, or loss of bowel/bladder control, go immediately to the ER. Follow up with [place] as scheduled or sooner if the pain does not begin to resolve or if it worsens before the follow up    Do the piriformis stretch as shown in clinic. You can also take a tennis ball or lacrosse ball and place it between your buttocks and the wall and roll the muscle.   Contact your orthopedics office for appointment since this has been ongoing for a few weeks. Call PCP for referral to ortho if you need one.    ED Prescriptions    Medication Sig Dispense Auth. Provider   celecoxib (CELEBREX) 100 MG capsule Take 1 capsule (100 mg total) by mouth 2 (two) times daily for 15 days. 30 capsule Danton Clap, PA-C     PDMP not reviewed this encounter.   Danton Clap, PA-C 03/25/20 1553

## 2020-03-25 NOTE — ED Triage Notes (Signed)
Patient in today with her daughter who states patient is c/o left hip pain x yesterday. Patient and daughter deny any injury. Patient has taken OTC Tylenol at 7am this morning and applied heat to the area.

## 2020-03-25 NOTE — Discharge Instructions (Addendum)
BACK PAIN: Stressed avoiding painful activities . RICE (REST, ICE, COMPRESSION, ELEVATION) guidelines reviewed. May alternate ice and heat. Consider use of muscle rubs, Salonpas patches, etc. Use medications as directed including muscle relaxers if prescribed. Take anti-inflammatory medications as prescribed or OTC NSAIDs/Tylenol.  F/u with PCP in 7-10 days for reexamination, and please feel free to call or return to the urgent care at any time for any questions or concerns you may have and we will be happy to help you!   BACK PAIN RED FLAGS: If the back pain acutely worsens or there are any red flag symptoms such as numbness/tingling, leg weakness, saddle anesthesia, or loss of bowel/bladder control, go immediately to the ER. Follow up with [place] as scheduled or sooner if the pain does not begin to resolve or if it worsens before the follow up    Do the piriformis stretch as shown in clinic. You can also take a tennis ball or lacrosse ball and place it between your buttocks and the wall and roll the muscle.   Contact your orthopedics office for appointment since this has been ongoing for a few weeks. Call PCP for referral to ortho if you need one.

## 2020-08-02 ENCOUNTER — Ambulatory Visit: Payer: Medicare Other | Attending: Internal Medicine

## 2020-08-02 DIAGNOSIS — Z23 Encounter for immunization: Secondary | ICD-10-CM

## 2020-08-02 NOTE — Progress Notes (Signed)
   Covid-19 Vaccination Clinic  Name:  Brooke Moyer    MRN: [id number] DOB: November 14, 1932  08/02/2020  Brooke Moyer was observed post Covid-19 immunization for 15 minutes without incident. She was provided with Vaccine Information Sheet and instruction to access the V-Safe system.   Brooke Moyer was instructed to call 911 with any severe reactions post vaccine: Difficulty breathing  Swelling of face and throat  A fast heartbeat  A bad rash all over body  Dizziness and weakness   Immunizations Administered     Name Date Dose VIS Date Route   PFIZER Comrnaty([place] TOP) Covid-19 Vaccine 08/02/2020 11:05 AM 0.3 mL 01/04/2020 Intramuscular   Manufacturer: [place]   Lot: [id number]   [place]: [place], PharmD, MBA Clinical Acute Care Pharmacist

## 2020-08-06 ENCOUNTER — Other Ambulatory Visit: Payer: Self-pay

## 2020-08-06 MED ORDER — COVID-19 MRNA VAC-TRIS(PFIZER) 30 MCG/0.3ML IM SUSP
INTRAMUSCULAR | 0 refills | Status: DC
  Filled 2020-08-06: qty 0.3, 30d supply, fill #0

## 2020-09-16 IMAGING — MG DIGITAL SCREENING BILATERAL MAMMOGRAM WITH TOMO AND CAD
8 series · 9 of 24 positions shown · non-contrast
Comparison: Previous exam(s).

CLINICAL DATA: Screening.

EXAM:
DIGITAL SCREENING BILATERAL MAMMOGRAM WITH TOMO AND CAD

[R MLO synth-2D]
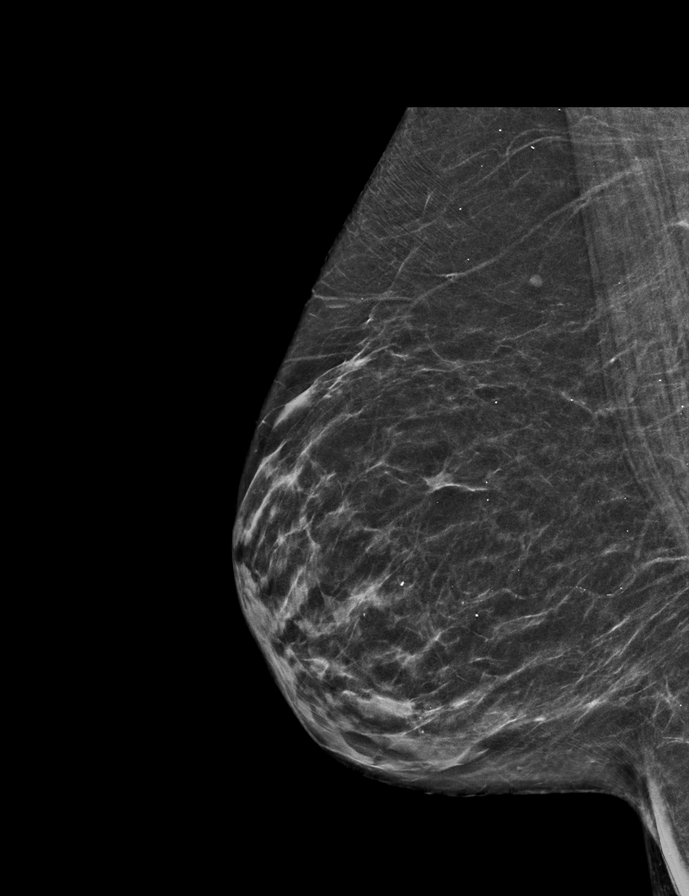

[L MLO synth-2D]
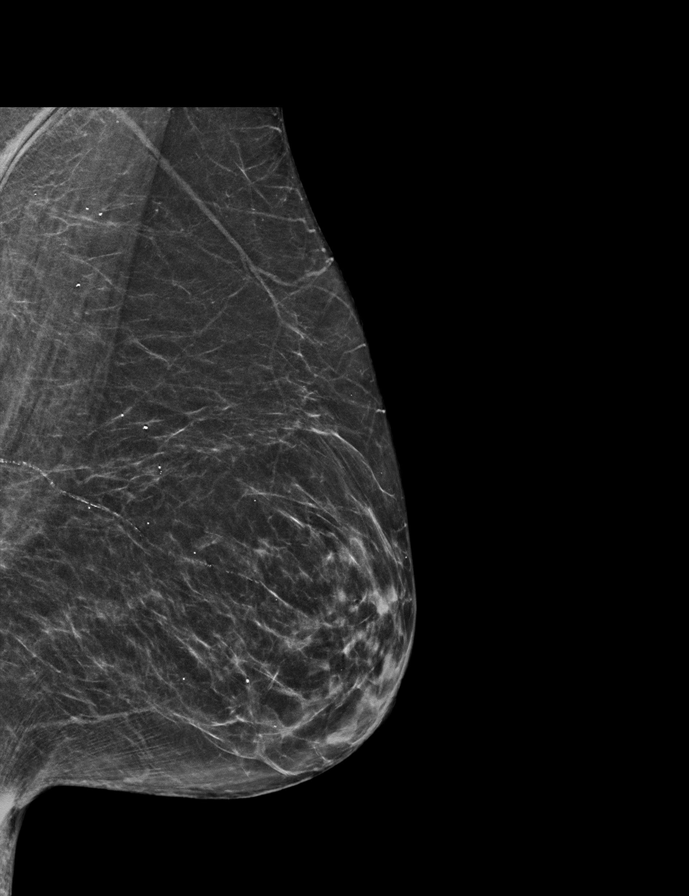

[L CC synth-2D]
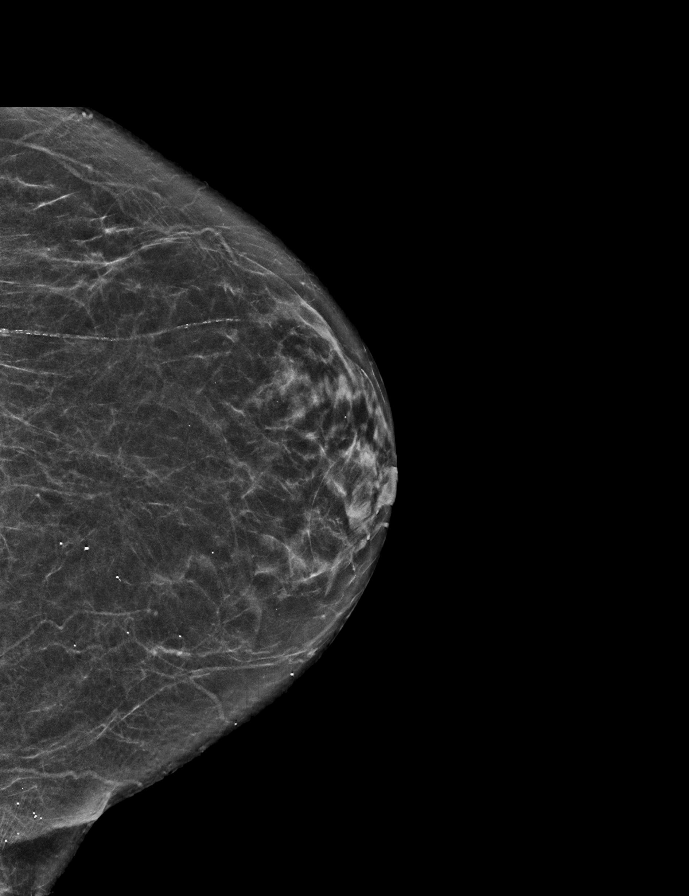

[R CC synth-2D]
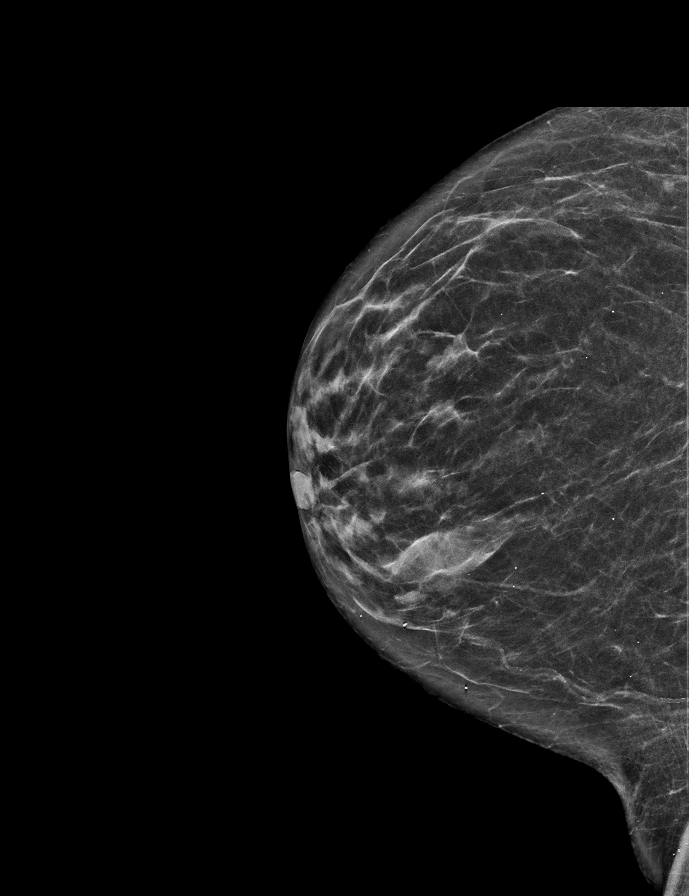

[L CC tomo · 2 of 53 frames shown]
[frame 18/53]
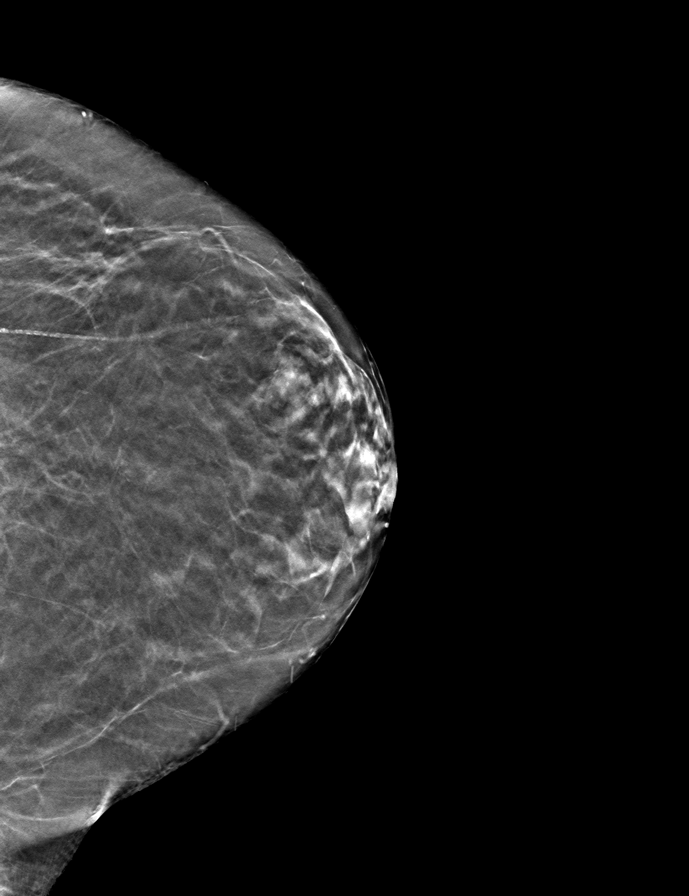
[frame 27/53]
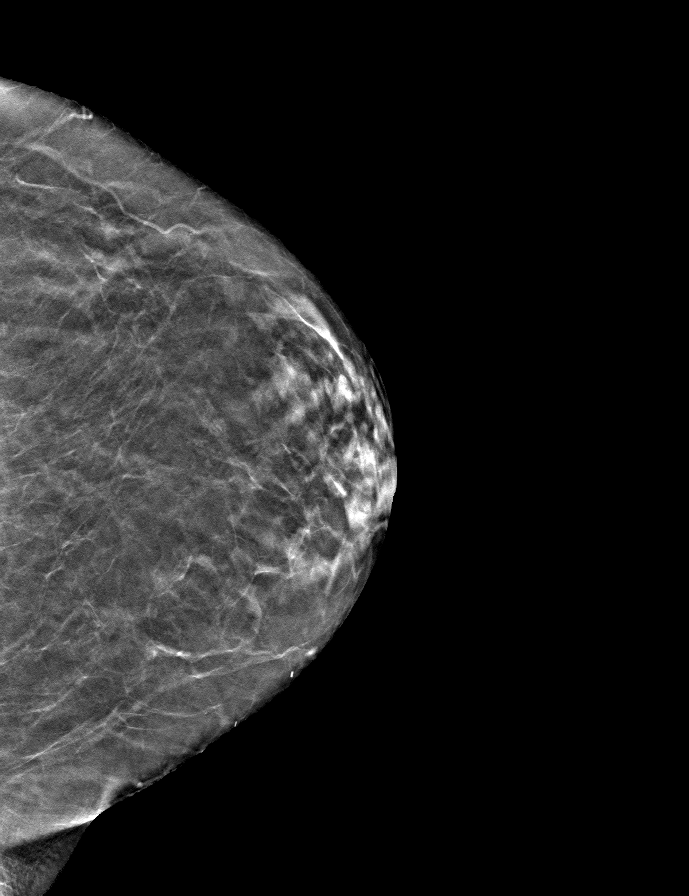

[R MLO tomo · tomo slice 29/56.0]
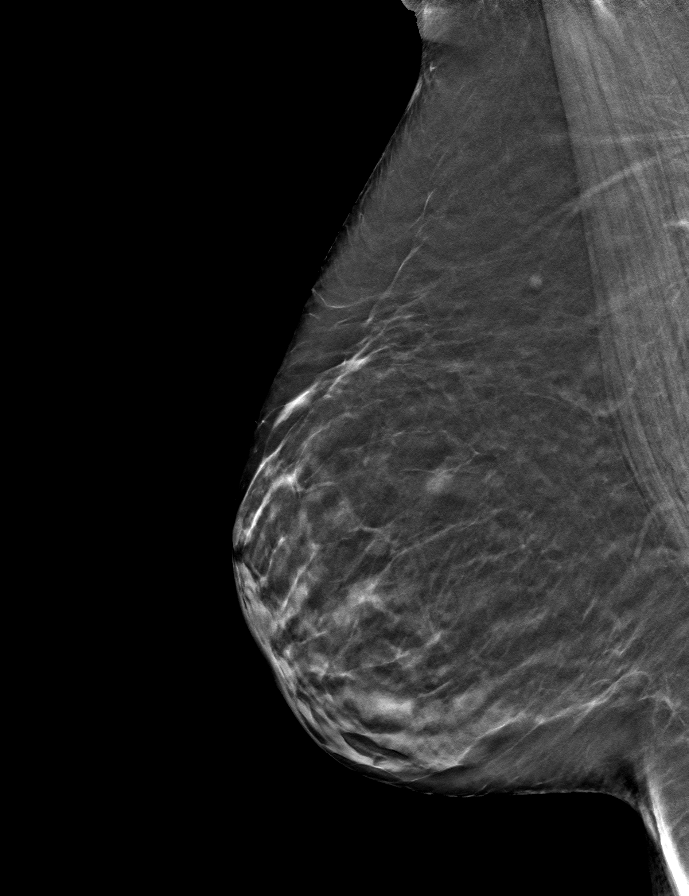

[L MLO tomo · tomo slice 30/59.0]
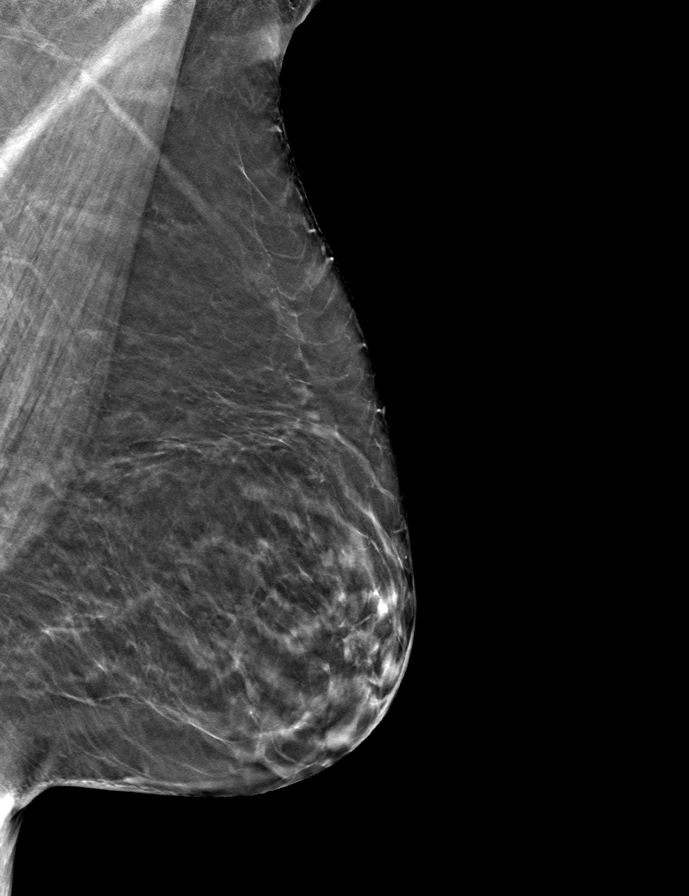

[R CC tomo · tomo slice 28/55.0]
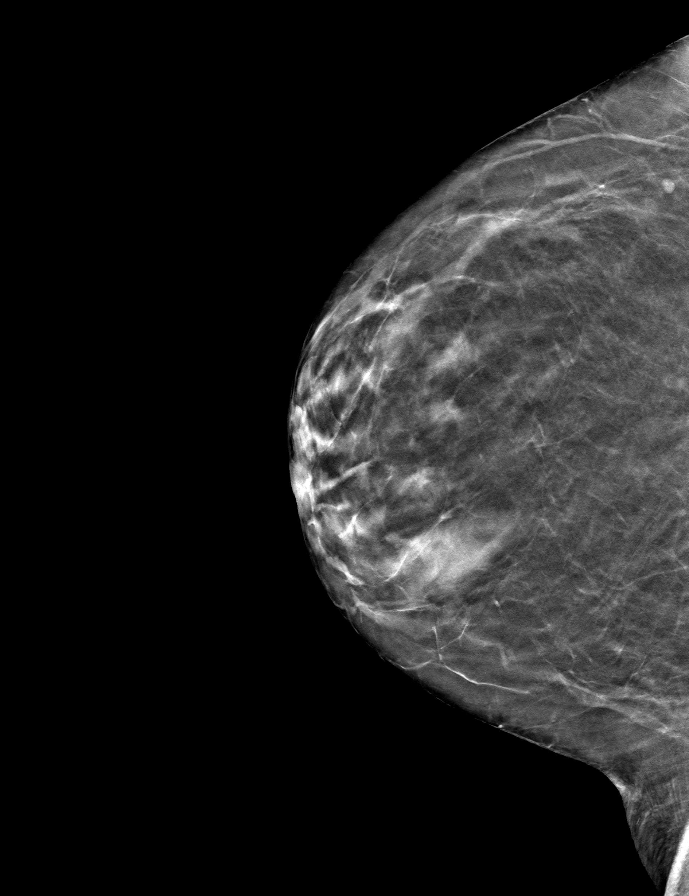

[9 of 24 positions shown; findings below may reference images not displayed]

ACR Breast Density Category b: There are scattered areas of
fibroglandular density.
FINDINGS: There are no findings suspicious for malignancy. Images were
processed with CAD.
IMPRESSION: No mammographic evidence of malignancy. A result letter of this
screening mammogram will be mailed directly to the patient.

RECOMMENDATION:
Screening mammogram in one year. (Code:[id number]-775)

BI-RADS CATEGORY  1: Negative.

## 2020-12-31 ENCOUNTER — Other Ambulatory Visit: Payer: Self-pay

## 2020-12-31 ENCOUNTER — Ambulatory Visit: Payer: Medicare Other | Attending: Internal Medicine

## 2020-12-31 DIAGNOSIS — Z23 Encounter for immunization: Secondary | ICD-10-CM

## 2020-12-31 MED ORDER — PFIZER COVID-19 VAC BIVALENT 30 MCG/0.3ML IM SUSP
INTRAMUSCULAR | 0 refills | Status: DC
  Filled 2020-12-31: qty 0.3, 1d supply, fill #0

## 2020-12-31 NOTE — Progress Notes (Signed)
   Covid-19 Vaccination Clinic  Name:  Brooke Moyer    MRN: [id number] DOB: [birth year]  12/31/2020  [name] was observed post Covid-19 immunization for 15 minutes without incident. She was provided with Vaccine Information Sheet and instruction to access the V-Safe system.   [name] was instructed to call 911 with any severe reactions post vaccine: Difficulty breathing  Swelling of face and throat  A fast heartbeat  A bad rash all over body  Dizziness and weakness   Immunizations Administered     Name Date Dose VIS Date Route   Pfizer Covid-19 Vaccine Bivalent Booster 12/31/2020 11:33 AM 0.3 mL 09/25/2020 Intramuscular   Manufacturer: [place]   Lot: [id number]   [place]: [place], PharmD, MBA Clinical Acute Care Pharmacist

## 2021-02-01 ENCOUNTER — Other Ambulatory Visit: Payer: Self-pay

## 2021-02-01 ENCOUNTER — Ambulatory Visit
Admission: EM | Admit: 2021-02-01 | Discharge: 2021-02-01 | Disposition: A | Discharge status: Home / Self Care | Payer: Medicare Other

## 2021-02-01 ENCOUNTER — Encounter: Payer: Self-pay | Admitting: Emergency Medicine

## 2021-02-01 DIAGNOSIS — W19XXXA Unspecified fall, initial encounter: Secondary | ICD-10-CM

## 2021-02-01 DIAGNOSIS — S0990XA Unspecified injury of head, initial encounter: Secondary | ICD-10-CM

## 2021-02-01 NOTE — ED Triage Notes (Signed)
Patient states that she was stepping off her porch and her daughter called her name and she quickly turned and fell back and hit the right side of her head on the sidewalk.  Patient has a knot on the side of her head.  Daughter denies LOC.

## 2021-02-01 NOTE — ED Provider Notes (Signed)
[facility]    CSN: [id number] Arrival date & time: 02/01/21  1059      History   Chief Complaint Chief Complaint  Patient presents with   Fall   Head Injury    HPI Brooke Moyer is a 88 y.o. female.   Brooke Moyer, 88 year old female pt presents w daughter to Urgent care after falling and striking head on concrete PTA. Patient states that she was stepping off her porch and her daughter called her name and she quickly turned and fell back and hit the right side of her head on the sidewalk.  Patient has a knot on the side of her head.  Daughter denies LOC. No vomiting, no nausea,no visual disturbance(wears glasses)GCS 15  The history is provided by the patient and a relative. No language interpreter was used.   Past Medical History:  Diagnosis Date   Cancer [facility])    skin cancer on left cheek   Vertigo     Patient Active Problem List   Diagnosis Date Noted   Fall 02/01/2021   Head injury 02/01/2021    Past Surgical History:  Procedure Laterality Date   BREAST BIOPSY Left years ago   cyst   SKIN BIOPSY      OB History   No obstetric history on file.      Home Medications    Prior to Admission medications   Medication Sig Start Date End Date Taking? Authorizing Provider  COVID-19 mRNA bivalent vaccine, Pfizer, (PFIZER COVID-19 VAC BIVALENT) injection Inject into the muscle. 12/31/20   Carlyle Basques, MD  COVID-19 mRNA Vac-TriS, Pfizer, SUSP injection Inject into the muscle. 08/02/20   Carlyle Basques, MD  cyanocobalamin 1000 MCG tablet Take by mouth.    [provider]  Multiple Vitamins-Minerals (PRESERVISION AREDS 2 PO) Take by mouth.    [provider]  predniSONE (STERAPRED UNI-PAK 21 TAB) 10 MG (21) TBPK tablet 6 tablets on day 1; decrease by 1 tablet daily until gone. 03/04/20   Coral Spikes, DO  senna (SENOKOT) 8.6 MG tablet Take by mouth.    [provider]    Family History Family History  Problem Relation Age  of Onset   Breast cancer Sister 70   Macular degeneration Mother    Other Father        unknown medical history    Social History Social History   Tobacco Use   Smoking status: Never   Smokeless tobacco: Never  Vaping Use   Vaping Use: Never used  Substance Use Topics   Alcohol use: No   Drug use: No     Allergies   Codeine   Review of Systems Review of Systems  Skin:  Positive for wound.       Head injury  All other systems reviewed and are negative.   Physical Exam Triage Vital Signs ED Triage Vitals  Enc Vitals Group     BP 02/01/21 1150 (!) 159/95     Pulse Rate 02/01/21 1150 74     Resp 02/01/21 1150 14     Temp 02/01/21 1150 98.3 F (36.8 C)     Temp Source 02/01/21 1150 Oral     SpO2 02/01/21 1150 95 %     Weight 02/01/21 1149 121 lb 14.6 oz (55.3 kg)     Height 02/01/21 1149 5\' 2"  (1.575 m)     Head Circumference --      Peak Flow --  Pain Score 02/01/21 1148 3     Pain Loc --      Pain Edu? --      Excl. in [place]? --    No data found.  Updated Vital Signs BP (!) 159/95 (BP Location: Left Arm)    Pulse 74    Temp 98.3 F (36.8 C) (Oral)    Resp 14    Ht 5\' 2"  (1.575 m)    Wt 121 lb 14.6 oz (55.3 kg)    SpO2 95%    BMI 22.30 kg/m   Visual Acuity Right Eye Distance:   Left Eye Distance:   Bilateral Distance:    Right Eye Near:   Left Eye Near:    Bilateral Near:     Physical Exam Vitals and nursing note reviewed.  Constitutional:      General: She is not in acute distress.    Appearance: She is well-developed and well-groomed.  HENT:     Head: Contusion present. No raccoon eyes or Battle's sign.      Comments: No active bleeding,hematoma as noted Eyes:     General: Lids are normal.     Extraocular Movements: Extraocular movements intact.     Conjunctiva/sclera: Conjunctivae normal.     Pupils: Pupils are equal, round, and reactive to light.  Neck:     Trachea: Trachea normal.  Cardiovascular:     Rate and Rhythm: Normal rate  and regular rhythm.     Pulses: Normal pulses.     Heart sounds: Normal heart sounds. No murmur heard. Pulmonary:     Effort: Pulmonary effort is normal. No respiratory distress.     Breath sounds: Normal breath sounds and air entry.  Abdominal:     Palpations: Abdomen is soft.     Tenderness: There is no abdominal tenderness.  Musculoskeletal:        General: No swelling.     Cervical back: Normal range of motion and neck supple.  Skin:    General: Skin is warm and dry.     Capillary Refill: Capillary refill takes less than 2 seconds.  Neurological:     General: No focal deficit present.     Mental Status: She is alert and oriented to person, place, and time.     GCS: GCS eye subscore is 4. GCS verbal subscore is 5. GCS motor subscore is 6.  Psychiatric:        Attention and Perception: Attention normal.        Mood and Affect: Mood normal.        Speech: Speech normal.        Behavior: Behavior normal. Behavior is cooperative.     UC Treatments / Results  Labs (all labs ordered are listed, but only abnormal results are displayed) Labs Reviewed - No data to display  EKG   Radiology No results found.  Procedures Procedures (including critical care time)  Medications Ordered in UC Medications - No data to display  Initial Impression / Assessment and Plan / UC Course  I have reviewed the triage vital signs and the nursing notes.  Pertinent labs & imaging results that were available during my care of the patient were reviewed by me and considered in my medical decision making (see chart for details).    Ddx: Fall, head injury, cva, fracture Final Clinical Impressions(s) / UC Diagnoses   Final diagnoses:  Fall, initial encounter  Injury of head, initial encounter     Discharge Instructions  Go to Er immediately, recommend head and cervical spine CT d/t MOI.     ED Prescriptions   None    PDMP not reviewed this encounter.   Tori Milks,  NP 91/63/84 1504

## 2021-02-01 NOTE — Discharge Instructions (Addendum)
Go to Er immediately, recommend head and cervical spine CT d/t MOI.

## 2021-02-01 NOTE — ED Notes (Signed)
Patient is being discharged from the Urgent Care and sent to the [facility] Emergency Department via private vehicle with daughter . Per Tori Milks, NP, patient is in need of higher level of care due to needing CT scan and further evaluation. Patient is aware and verbalizes understanding of plan of care.  Vitals:   02/01/21 1150  BP: (!) 159/95  Pulse: 74  Resp: 14  Temp: 98.3 F (36.8 C)  SpO2: 95%

## 2021-02-05 DIAGNOSIS — F0280 Dementia in other diseases classified elsewhere without behavioral disturbance: Secondary | ICD-10-CM | POA: Insufficient documentation

## 2021-06-11 ENCOUNTER — Ambulatory Visit
Admission: EM | Admit: 2021-06-11 | Discharge: 2021-06-11 | Disposition: A | Discharge status: Home / Self Care | Payer: Medicare Other | Attending: Physician Assistant | Admitting: Physician Assistant

## 2021-06-11 DIAGNOSIS — U071 COVID-19: Secondary | ICD-10-CM | POA: Insufficient documentation

## 2021-06-11 DIAGNOSIS — J029 Acute pharyngitis, unspecified: Secondary | ICD-10-CM | POA: Diagnosis present

## 2021-06-11 DIAGNOSIS — R051 Acute cough: Secondary | ICD-10-CM | POA: Insufficient documentation

## 2021-06-11 LAB — SARS CORONAVIRUS 2 BY RT PCR: SARS Coronavirus 2 by RT PCR: POSITIVE — AB

## 2021-06-11 LAB — GROUP A STREP BY PCR: Group A Strep by PCR: NOT DETECTED

## 2021-06-11 MED ORDER — IPRATROPIUM BROMIDE 0.06 % NA SOLN: 2.0000 | Freq: Four times a day (QID) | NASAL | 0 refills | Status: DC

## 2021-06-11 MED ORDER — NIRMATRELVIR/RITONAVIR (PAXLOVID)TABLET: 3.0000 | ORAL_TABLET | Freq: Two times a day (BID) | ORAL | 0 refills | Status: DC

## 2021-06-11 MED ORDER — BENZONATATE 200 MG PO CAPS: 200.0000 mg | ORAL_CAPSULE | Freq: Three times a day (TID) | ORAL | 0 refills | Status: DC | PRN

## 2021-06-11 MED ORDER — NIRMATRELVIR/RITONAVIR (PAXLOVID)TABLET: 3.0000 | ORAL_TABLET | Freq: Two times a day (BID) | ORAL | 0 refills | Status: AC

## 2021-06-11 NOTE — Discharge Instructions (Signed)
-  COVID test is positive.  Need to isolate 5 days and wear mask for 5 days.  Today is day 1. ?- I have sent antiviral medication to the pharmacy.  Start this so soon as possible.  The goal is to keep you out of the hospital.  You have had multiple vaccines so that is also protective. ?- I have sent a medication for cough and nasal spray. ?- Important to increase rest and fluids. ?- If uncontrolled fever, weakness, shortness of breath, increased confusion, take to emergency department. ?

## 2021-06-11 NOTE — ED Provider Notes (Signed)
?[place] ? ? ? ?CSN: [id number] ?Arrival date & time: 06/11/21  1222 ? ? ?  ? ?History   ?Chief Complaint ?Chief Complaint  ?Patient presents with  ? Cough  ? Nasal Congestion  ? ? ?HPI ?Brooke Moyer is a 88 y.o. female presenting with her daughter today for concern about low-grade temperature up to 99.3 degrees, nasal congestion, cough and complaints of sore throat.  Symptoms started last night but worsened today.  Daughter reports she seemed a little short of breath when she was going up the stairs today.  Daughter is helping to provide some of the history since patient has a history of Alzheimer's.  No high-grade fever.  No sick contacts.  Vaccinated for COVID-19 and boosted.  Has tried hot tea and other home remedies without improvement in symptoms. ? ?HPI ? ?Past Medical History:  ?Diagnosis Date  ? Cancer [facility])   ? skin cancer on left cheek  ? Vertigo   ? ? ?Patient Active Problem List  ? Diagnosis Date Noted  ? Alzheimer's dementia without behavioral disturbance ([place]) 02/05/2021  ? Head injury 02/01/2021  ? Fall 03/12/2020  ? Secondary hypertension 03/12/2020  ? Piriformis syndrome of right side 03/12/2020  ? Constipation 10/03/2019  ? Palpitations 05/11/2019  ? Dysphagia 05/11/2019  ? Abnormal weight loss 01/04/2019  ? Presbycusis 11/03/2018  ? Advanced care planning/counseling discussion 11/03/2018  ? Ovarian deficiency 08/26/2016  ? PUD (peptic ulcer disease) 09/21/2013  ? Hyperlipidemia 09/21/2013  ? Esophageal reflux 09/21/2013  ? Thoracic or lumbosacral neuritis or radiculitis 03/24/2011  ? ? ?Past Surgical History:  ?Procedure Laterality Date  ? BREAST BIOPSY Left years ago  ? cyst  ? SKIN BIOPSY    ? ? ?OB History   ?No obstetric history on file. ?  ? ? ? ?Home Medications   ? ?Prior to Admission medications   ?Medication Sig Start Date End Date Taking? Authorizing Provider  ?acetaminophen (TYLENOL) 500 MG tablet Take by mouth.    [provider]  ?benzonatate (TESSALON) 200  MG capsule Take 1 capsule (200 mg total) by mouth 3 (three) times daily as needed for cough. 06/11/21   Danton Clap, PA-C  ?COVID-19 mRNA bivalent vaccine, Pfizer, (PFIZER COVID-19 VAC BIVALENT) injection Inject into the muscle. 12/31/20   Carlyle Basques, MD  ?COVID-19 mRNA Vac-TriS, Pfizer, SUSP injection Inject into the muscle. 08/02/20   Carlyle Basques, MD  ?COVID-19 mRNA vaccine, Pfizer, 30 MCG/0.3ML injection Inject into the muscle. 08/02/20   [provider]  ?cyanocobalamin 1000 MCG tablet Take by mouth.    [provider]  ?cyanocobalamin 1000 MCG tablet Take by mouth.    [provider]  ?ipratropium (ATROVENT) 0.06 % nasal spray Place 2 sprays into both nostrils 4 (four) times daily. 06/11/21   Danton Clap, PA-C  ?Multiple Vitamins-Minerals (PRESERVISION AREDS 2 PO) Take by mouth.    [provider]  ?nirmatrelvir/ritonavir EUA (PAXLOVID) 20 x 150 MG & 10 x '100MG'$  TABS Take 3 tablets by mouth 2 (two) times daily for 5 days. Patient GFR is 78. Take nirmatrelvir (150 mg) two tablets twice daily for 5 days and ritonavir (100 mg) one tablet twice daily for 5 days. 06/11/21 06/16/21  Danton Clap, PA-C  ?predniSONE (STERAPRED UNI-PAK 21 TAB) 10 MG (21) TBPK tablet 6 tablets on day 1; decrease by 1 tablet daily until gone. 03/04/20   Coral Spikes, DO  ?Propylene Glycol 0.6 % SOLN Apply to eye.  [provider]  ?senna (SENOKOT) 8.6 MG tablet Take by mouth.    [provider]  ? ? ?Family History ?Family History  ?Problem Relation Age of Onset  ? Breast cancer Sister 14  ? Macular degeneration Mother   ? Other Father   ?     unknown medical history  ? ? ?Social History ?Social History  ? ?Tobacco Use  ? Smoking status: Never  ? Smokeless tobacco: Never  ?Vaping Use  ? Vaping Use: Never used  ?Substance Use Topics  ? Alcohol use: No  ? Drug use: No  ? ? ? ?Allergies   ?Codeine ? ? ?Review of Systems ?Review of Systems  ?Constitutional:  Negative for chills,  diaphoresis, fatigue and fever.  ?HENT:  Positive for congestion, rhinorrhea and sore throat. Negative for ear pain, sinus pressure and sinus pain.   ?Respiratory:  Positive for cough. Negative for shortness of breath.   ?Gastrointestinal:  Negative for abdominal pain, nausea and vomiting.  ?Musculoskeletal:  Negative for arthralgias and myalgias.  ?Skin:  Negative for rash.  ?Neurological:  Negative for weakness and headaches.  ?Hematological:  Negative for adenopathy.  ? ? ?Physical Exam ?Triage Vital Signs ?ED Triage Vitals  ?Enc Vitals Group  ?   BP   ?   Pulse   ?   Resp   ?   Temp   ?   Temp src   ?   SpO2   ?   Weight   ?   Height   ?   Head Circumference   ?   Peak Flow   ?   Pain Score   ?   Pain Loc   ?   Pain Edu?   ?   Excl. in [place]?   ? ?No data found. ? ?Updated Vital Signs ?BP 103/63 (BP Location: Left Arm)   Pulse 82   Temp 97.9 ?F (36.6 ?C) (Oral)   Resp 18   Ht '5\' 2"'$  (1.575 m)   Wt 121 lb 14.6 oz (55.3 kg)   SpO2 97%   BMI 22.30 kg/m?  ?Physical Exam ?Vitals and nursing note reviewed.  ?Constitutional:   ?   General: She is not in acute distress. ?   Appearance: Normal appearance. She is ill-appearing. She is not toxic-appearing.  ?HENT:  ?   Head: Normocephalic and atraumatic.  ?   Nose: Congestion present.  ?   Mouth/Throat:  ?   Mouth: Mucous membranes are moist.  ?   Pharynx: Oropharynx is clear. Posterior oropharyngeal erythema present.  ?Eyes:  ?   General: No scleral icterus.    ?   Right eye: No discharge.     ?   Left eye: No discharge.  ?   Conjunctiva/sclera: Conjunctivae normal.  ?Cardiovascular:  ?   Rate and Rhythm: Normal rate and regular rhythm.  ?   Heart sounds: Normal heart sounds.  ?Pulmonary:  ?   Effort: Pulmonary effort is normal. No respiratory distress.  ?   Breath sounds: Normal breath sounds.  ?Musculoskeletal:  ?   Cervical back: Neck supple.  ?Skin: ?   General: Skin is dry.  ?Neurological:  ?   General: No focal deficit present.  ?   Mental Status: She is alert.  Mental status is at baseline.  ?   Motor: No weakness.  ?   Gait: Gait normal.  ?Psychiatric:     ?   Mood and Affect: Mood normal.     ?  Behavior: Behavior normal.     ?   Thought Content: Thought content normal.  ? ? ? ?UC Treatments / Results  ?Labs ?(all labs ordered are listed, but only abnormal results are displayed) ?Labs Reviewed  ?SARS CORONAVIRUS 2 BY RT PCR - Abnormal; Notable for the following components:  ?    Result Value  ? SARS Coronavirus 2 by RT PCR POSITIVE (*)   ? All other components within normal limits  ?GROUP A STREP BY PCR  ? ? ?EKG ? ? ?Radiology ?No results found. ? ?Procedures ?Procedures (including critical care time) ? ?Medications Ordered in UC ?Medications - No data to display ? ?Initial Impression / Assessment and Plan / UC Course  ?I have reviewed the triage vital signs and the nursing notes. ? ?Pertinent labs & imaging results that were available during my care of the patient were reviewed by me and considered in my medical decision making (see chart for details). ? ?88 year old female with history of Alzheimer's presenting for onset of low-grade temps up to 99.3 degrees, nasal congestion, sore throat and cough yesterday.  Symptoms worsening today when she woke up. ? ?Vitals normal and stable.  Patient is mildly ill-appearing but nontoxic.  On exam she does have nasal congestion as well as erythema posterior pharynx.  Her chest is clear auscultation heart regular rate and rhythm. ? ?PCR strep test and rapid COVID testing obtained.  Negative strep.  Positive COVID-19.  Fully vaccinated for COVID 19 and boosted. Discussed result with patient's daughter and patient.  Sent Paxlovid to pharmacy.  Last GFR was done at the end of March and was 35.  No contradicting medications.  Also sent benzonatate and Atrovent nasal spray.  Encouraged increasing rest and fluids.  Reviewed current CDC guidelines, isolation protocol and ED precautions. ? ? ?Final Clinical Impressions(s) / UC Diagnoses   ? ?Final diagnoses:  ?Sore throat  ?Acute cough  ?COVID-19  ? ? ? ?Discharge Instructions   ? ?  ?-COVID test is positive.  Need to isolate 5 days and wear mask for 5 days.  Today is day 1. ?- I have sent a

## 2021-06-11 NOTE — ED Triage Notes (Signed)
Patient is here with Family member for "Cough & Congestion". "Real hoarse this morning". Temperature "99.3" (high for her). No sob. No rash.  ?

## 2021-06-30 ENCOUNTER — Ambulatory Visit
Admission: EM | Admit: 2021-06-30 | Discharge: 2021-06-30 | Disposition: A | Discharge status: Home / Self Care | Payer: Medicare Other

## 2021-06-30 DIAGNOSIS — S70361A Insect bite (nonvenomous), right thigh, initial encounter: Secondary | ICD-10-CM

## 2021-06-30 DIAGNOSIS — W57XXXA Bitten or stung by nonvenomous insect and other nonvenomous arthropods, initial encounter: Secondary | ICD-10-CM | POA: Diagnosis not present

## 2021-06-30 DIAGNOSIS — S80851A Superficial foreign body, right lower leg, initial encounter: Secondary | ICD-10-CM

## 2021-06-30 NOTE — Discharge Instructions (Signed)
-  I was able to remove the rest of the tick. - Keep this area clean with soap and water.  Apply hydrocortisone cream to help with itching and swelling. - Return or go to ER for any red bull's-eye type ring around the rash or any associated fever, fatigue, achiness, weakness, flulike symptoms.  Concerned about tickborne illnesses, may follow-up with PCP for lab work in about 6 weeks, but as we discussed this is not an endemic area for tickborne diseases so she is low risk.

## 2021-06-30 NOTE — ED Provider Notes (Signed)
[facility]    CSN: [id number] Arrival date & time: 06/30/21  9470      History   Chief Complaint No chief complaint on file.   HPI Brooke Moyer is a 88 y.o. female with history of Alzheimer's disease.  Patient is presenting with her daughter today who is helping to provide some of the history.  Patient's daughter reports another family member found a tick on the patient's right upper thigh/groin region yesterday and pulled the tick out but realized the head remained embedded in the skin.  Patient denies any pain, swelling or itching.  There is redness around the tick bite site.  No erythema migrans type rash.  No reports of any fever or body aches.  Patient's daughter hoping to have the remainder of the tick removed today.  No other complaints.  HPI  Past Medical History:  Diagnosis Date   Cancer ([place])    skin cancer on left cheek   Vertigo     Patient Active Problem List   Diagnosis Date Noted   Alzheimer's dementia without behavioral disturbance ([place]) 02/05/2021   Head injury 02/01/2021   Fall 03/12/2020   Secondary hypertension 03/12/2020   Piriformis syndrome of right side 03/12/2020   Constipation 10/03/2019   Palpitations 05/11/2019   Dysphagia 05/11/2019   Abnormal weight loss 01/04/2019   Presbycusis 11/03/2018   Advanced care planning/counseling discussion 11/03/2018   Ovarian deficiency 08/26/2016   PUD (peptic ulcer disease) 09/21/2013   Hyperlipidemia 09/21/2013   Esophageal reflux 09/21/2013   Thoracic or lumbosacral neuritis or radiculitis 03/24/2011    Past Surgical History:  Procedure Laterality Date   BREAST BIOPSY Left years ago   cyst   SKIN BIOPSY      OB History   No obstetric history on file.      Home Medications    Prior to Admission medications   Medication Sig Start Date End Date Taking? Authorizing Provider  acetaminophen (TYLENOL) 500 MG tablet Take by mouth.   Yes [provider]  cyanocobalamin 1000  MCG tablet Take by mouth.   Yes [provider]  cyanocobalamin 1000 MCG tablet Take by mouth.   Yes [provider]  Multiple Vitamins-Minerals (PRESERVISION AREDS 2 PO) Take by mouth.   Yes [provider]  Propylene Glycol 0.6 % SOLN Apply to eye.   Yes [provider]  senna (SENOKOT) 8.6 MG tablet Take by mouth.   Yes [provider]  benzonatate (TESSALON) 200 MG capsule Take 1 capsule (200 mg total) by mouth 3 (three) times daily as needed for cough. 06/11/21   Laurene Footman B, PA-C  COVID-19 mRNA bivalent vaccine, Pfizer, (PFIZER COVID-19 VAC BIVALENT) injection Inject into the muscle. 12/31/20   Carlyle Basques, MD  COVID-19 mRNA Vac-TriS, Pfizer, SUSP injection Inject into the muscle. 08/02/20   Carlyle Basques, MD  COVID-19 mRNA vaccine, Pfizer, 30 MCG/0.3ML injection Inject into the muscle. 08/02/20   [provider]  ipratropium (ATROVENT) 0.06 % nasal spray Place 2 sprays into both nostrils 4 (four) times daily. 06/11/21   Danton Clap, PA-C  predniSONE (STERAPRED UNI-PAK 21 TAB) 10 MG (21) TBPK tablet 6 tablets on day 1; decrease by 1 tablet daily until gone. 03/04/20   Coral Spikes, DO    Family History Family History  Problem Relation Age of Onset   Breast cancer Sister 78   Macular degeneration Mother    Other Father        unknown  medical history    Social History Social History   Tobacco Use   Smoking status: Never   Smokeless tobacco: Never  Vaping Use   Vaping Use: Never used  Substance Use Topics   Alcohol use: No   Drug use: No     Allergies   Codeine   Review of Systems Review of Systems  Constitutional:  Negative for fatigue and fever.  Musculoskeletal:  Negative for arthralgias, joint swelling and myalgias.  Skin:  Positive for rash.  Neurological:  Negative for weakness.    Physical Exam Triage Vital Signs ED Triage Vitals  Enc Vitals Group     BP 06/30/21 1006 (!) 156/83     Pulse Rate  06/30/21 1006 65     Resp 06/30/21 1006 18     Temp 06/30/21 1006 97.8 F (36.6 C)     Temp Source 06/30/21 1006 Oral     SpO2 06/30/21 1006 98 %     Weight 06/30/21 1003 120 lb (54.4 kg)     Height 06/30/21 1003 '5\' 4"'$  (1.626 m)     Head Circumference --      Peak Flow --      Pain Score 06/30/21 1003 0     Pain Loc --      Pain Edu? --      Excl. in [place]? --    No data found.  Updated Vital Signs BP (!) 156/83 (BP Location: Left Arm)   Pulse 65   Temp 97.8 F (36.6 C) (Oral)   Resp 18   Ht '5\' 4"'$  (1.626 m)   Wt 120 lb (54.4 kg)   SpO2 98%   BMI 20.60 kg/m   Physical Exam Vitals and nursing note reviewed.  Constitutional:      General: She is not in acute distress.    Appearance: Normal appearance. She is not ill-appearing or toxic-appearing.  HENT:     Head: Normocephalic and atraumatic.  Eyes:     General: No scleral icterus.       Right eye: No discharge.        Left eye: No discharge.     Conjunctiva/sclera: Conjunctivae normal.  Cardiovascular:     Rate and Rhythm: Normal rate and regular rhythm.     Heart sounds: Normal heart sounds.  Pulmonary:     Effort: Pulmonary effort is normal. No respiratory distress.     Breath sounds: Normal breath sounds.  Musculoskeletal:     Cervical back: Neck supple.  Skin:    General: Skin is dry.     Comments: Area of erythema and slight swelling right groin/thigh region with black center consistent with embedded tick head.  Area appears to be nontender.  Neurological:     General: No focal deficit present.     Mental Status: She is alert. Mental status is at baseline.     Motor: No weakness.     Gait: Gait normal.  Psychiatric:        Mood and Affect: Mood normal.        Behavior: Behavior normal.        Thought Content: Thought content normal.     UC Treatments / Results  Labs (all labs ordered are listed, but only abnormal results are displayed) Labs Reviewed - No data to display  EKG   Radiology No  results found.  Procedures Procedures (including critical care time)  Foreign body removal: Patient and daughter give consent for removal of embedded tick count  of right thigh/groin.  Cleaned area with alcohol and used 18-gauge needle and forceps to remove the tick head.  Cleaned area again with alcohol swab and applied Band-Aid.  Patient tolerated well.   Medications Ordered in UC Medications - No data to display  Initial Impression / Assessment and Plan / UC Course  I have reviewed the triage vital signs and the nursing notes.  Pertinent labs & imaging results that were available during my care of the patient were reviewed by me and considered in my medical decision making (see chart for details).  88 year old female presenting for embedded tick out of right thigh/groin region that family members noticed yesterday.  They pulled the tick out but realized that her head remained.  Small area of rash around the tick bite site but no associated pain, fever or flulike symptoms.  Patient and daughter consent for removal of embedded tick head.  He was able to successfully remove it with a 18-gauge needle and forceps.  Reviewed using hydrocortisone for this area to help with swelling and itch if needed.  Reviewed concerning signs and symptoms which could be related to tickborne illness.  Given this is not an endemic area, patient low risk for tickborne illness.  Reviewed following up as needed.   Final Clinical Impressions(s) / UC Diagnoses   Final diagnoses:  Foreign body of skin of lower extremity, right, initial encounter  Tick bite of right thigh, initial encounter     Discharge Instructions      -I was able to remove the rest of the tick. - Keep this area clean with soap and water.  Apply hydrocortisone cream to help with itching and swelling. - Return or go to ER for any red bull's-eye type ring around the rash or any associated fever, fatigue, achiness, weakness, flulike symptoms.   Concerned about tickborne illnesses, may follow-up with PCP for lab work in about 6 weeks, but as we discussed this is not an endemic area for tickborne diseases so she is low risk.     ED Prescriptions   None    PDMP not reviewed this encounter.   Danton Clap, PA-C 06/30/21 1132

## 2021-06-30 NOTE — ED Triage Notes (Signed)
Pt c/o tick bite.   Pt was staying with her daughter and had a tick on the right thigh. Pt states that she noticed the tick on 06/29/21.   Pt denies any swelling or pain.

## 2021-10-29 ENCOUNTER — Ambulatory Visit
Admission: EM | Admit: 2021-10-29 | Discharge: 2021-10-29 | Disposition: A | Discharge status: Home / Self Care | Payer: Medicare Other | Attending: Family Medicine | Admitting: Family Medicine

## 2021-10-29 ENCOUNTER — Encounter: Payer: Self-pay | Admitting: Emergency Medicine

## 2021-10-29 DIAGNOSIS — S41111A Laceration without foreign body of right upper arm, initial encounter: Secondary | ICD-10-CM

## 2021-10-29 MED ORDER — DOXYCYCLINE HYCLATE 100 MG PO CAPS: 100.0000 mg | ORAL_CAPSULE | Freq: Two times a day (BID) | ORAL | 0 refills | Status: AC

## 2021-10-29 NOTE — ED Provider Notes (Signed)
[facility]    CSN: [id number] Arrival date & time: 10/29/21  1211      History   Chief Complaint Chief Complaint  Patient presents with   skin tear    HPI Brooke Moyer is a 88 y.o. female.   HPI  Brooke Moyer brought in by her daughter for right forearm injury.  Patient's daughter states that she was carrying a cooler and the edge scraped her mom's arm.  This happened at home yesterday.  She dressed it and tried to wash it but the pain hurts so much so she did not finish washing it properly.  She presents to the urgent care today for it to be cleaned and evaluated.  Bleeding has been controlled.  No pain medications given prior to arrival.  Patient is right-handed.  There has been no weakness in the hand, no loss of consciousness, no fever, no nausea or vomiting.    Past Medical History:  Diagnosis Date   Cancer ([place])    skin cancer on left cheek   Vertigo     Patient Active Problem List   Diagnosis Date Noted   Alzheimer's dementia without behavioral disturbance ([place]) 02/05/2021   Head injury 02/01/2021   Fall 03/12/2020   Secondary hypertension 03/12/2020   Piriformis syndrome of right side 03/12/2020   Constipation 10/03/2019   Palpitations 05/11/2019   Dysphagia 05/11/2019   Abnormal weight loss 01/04/2019   Presbycusis 11/03/2018   Advanced care planning/counseling discussion 11/03/2018   Ovarian deficiency 08/26/2016   PUD (peptic ulcer disease) 09/21/2013   Hyperlipidemia 09/21/2013   Esophageal reflux 09/21/2013   Thoracic or lumbosacral neuritis or radiculitis 03/24/2011    Past Surgical History:  Procedure Laterality Date   BREAST BIOPSY Left years ago   cyst   SKIN BIOPSY      OB History   No obstetric history on file.      Home Medications    Prior to Admission medications   Medication Sig Start Date End Date Taking? Authorizing Provider  doxycycline (VIBRAMYCIN) 100 MG capsule Take 1 capsule (100 mg total) by mouth 2 (two)  times daily for 7 days. 10/29/21 11/05/21 Yes Atari Novick, DO  acetaminophen (TYLENOL) 500 MG tablet Take by mouth.    [provider]  benzonatate (TESSALON) 200 MG capsule Take 1 capsule (200 mg total) by mouth 3 (three) times daily as needed for cough. 06/11/21   Laurene Footman B, PA-C  COVID-19 mRNA bivalent vaccine, Pfizer, (PFIZER COVID-19 VAC BIVALENT) injection Inject into the muscle. 12/31/20   Carlyle Basques, MD  COVID-19 mRNA Vac-TriS, Pfizer, SUSP injection Inject into the muscle. 08/02/20   Carlyle Basques, MD  COVID-19 mRNA vaccine, Pfizer, 30 MCG/0.3ML injection Inject into the muscle. 08/02/20   [provider]  cyanocobalamin 1000 MCG tablet Take by mouth.    [provider]  cyanocobalamin 1000 MCG tablet Take by mouth.    [provider]  ipratropium (ATROVENT) 0.06 % nasal spray Place 2 sprays into both nostrils 4 (four) times daily. 06/11/21   Danton Clap, PA-C  Multiple Vitamins-Minerals (PRESERVISION AREDS 2 PO) Take by mouth.    [provider]  predniSONE (STERAPRED UNI-PAK 21 TAB) 10 MG (21) TBPK tablet 6 tablets on day 1; decrease by 1 tablet daily until gone. 03/04/20   Coral Spikes, DO  Propylene Glycol 0.6 % SOLN Apply to eye.    [provider]  senna (SENOKOT) 8.6 MG tablet Take by mouth.  [provider]    Family History Family History  Problem Relation Age of Onset   Breast cancer Sister 39   Macular degeneration Mother    Other Father        unknown medical history    Social History Social History   Tobacco Use   Smoking status: Never   Smokeless tobacco: Never  Vaping Use   Vaping Use: Never used  Substance Use Topics   Alcohol use: No   Drug use: No     Allergies   Codeine   Review of Systems Review of Systems :negative unless otherwise stated in HPI.      Physical Exam Triage Vital Signs ED Triage Vitals  Enc Vitals Group     BP 10/29/21 1304 126/74     Pulse Rate  10/29/21 1304 69     Resp 10/29/21 1304 16     Temp 10/29/21 1304 98 F (36.7 C)     Temp Source 10/29/21 1304 Oral     SpO2 10/29/21 1304 99 %     Weight --      Height --      Head Circumference --      Peak Flow --      Pain Score 10/29/21 1303 0     Pain Loc --      Pain Edu? --      Excl. in [place]? --    No data found.  Updated Vital Signs BP 126/74   Pulse 69   Temp 98 F (36.7 C) (Oral)   Resp 16   SpO2 99%   Visual Acuity Right Eye Distance:   Left Eye Distance:   Bilateral Distance:    Right Eye Near:   Left Eye Near:    Bilateral Near:     Physical Exam  GEN: alert, pleasant elderly female, in no acute distress  EYES: extra occular movements intact, no scleral injection  CV: regular rate, good radial pulses bilaterally RESP: no increased work of breathing MSK: no extremity edema, no gross deformities NEURO: alert, moves all extremities appropriately PSYCH: Normal affect, appropriate speech and behavior  SKIN: warm and dry; 3 cm x4 cm crescent-shaped skin tear on the right forearm, minimal bleeding, no warmth, purpura surrounding lesion   UC Treatments / Results  Labs (all labs ordered are listed, but only abnormal results are displayed) Labs Reviewed - No data to display  EKG   Radiology No results found.  Procedures Laceration Repair  Date/Time: 10/29/2021 9:49 PM  Performed by: Lyndee Hensen, DO Authorized by: Lyndee Hensen, DO   Consent:    Consent obtained:  Verbal   Consent given by:  Patient   Risks, benefits, and alternatives were discussed: yes   Universal protocol:    Patient identity confirmed:  Verbally with patient Anesthesia:    Anesthesia method:  None Laceration details:    Location: right forearm.   Wound length (cm): 3 cm x 4 cm. Treatment:    Area cleansed with:  Shur-Clens and soap and water   Amount of cleaning:  Standard Skin repair:    Repair method:  Tissue adhesive Repair type:    Repair type:   Simple Post-procedure details:    Dressing:  Open (no dressing)   Procedure completion:  Tolerated  (including critical care time)  Medications Ordered in UC Medications - No data to display  Initial Impression / Assessment and Plan / UC Course  I have reviewed the triage vital signs and  the nursing notes.  Pertinent labs & imaging results that were available during my care of the patient were reviewed by me and considered in my medical decision making (see chart for details).     Patient is a 88 y.o. female with dementia who presents for skin tear that happened yesterday after being injured by a cooler.  Overall, patient is well-appearing and well-hydrated.  Vital signs stable.  Jaeley is afebrile.  Discussed different ways to have her skin repair itself.  Ultimately through shared decision making it was closed with Dermabond.  Antibiotics sent to the pharmacy.  Patient to follow-up with primary care provider in the next 1 to 2 weeks.  Daughter voiced understanding.  Reviewed expectations regarding course of current medical issues.  All questions asked were answered.  Outlined signs and symptoms indicating need for more acute intervention. Patient verbalized understanding. After Visit Summary given.   Final Clinical Impressions(s) / UC Diagnoses   Final diagnoses:  Skin tear of right upper extremity     Discharge Instructions      Stop by the pharmacy to pick up your prescriptions.  Follow up with your primary care provider as needed.  Dermabond will come off on its own with time. See handout for more information.     ED Prescriptions     Medication Sig Dispense Auth. Provider   doxycycline (VIBRAMYCIN) 100 MG capsule Take 1 capsule (100 mg total) by mouth 2 (two) times daily for 7 days. 14 capsule Jeania Nater, DO      PDMP not reviewed this encounter.              Lyndee Hensen, DO 10/29/21 2152

## 2021-10-29 NOTE — Discharge Instructions (Addendum)
Stop by the pharmacy to pick up your prescriptions.  Follow up with your primary care provider as needed.  Dermabond will come off on its own with time. See handout for more information.

## 2021-10-29 NOTE — ED Triage Notes (Signed)
Pt has a skin tear to her right forearm. She scraped her arm against a cooler.

## 2022-04-17 ENCOUNTER — Ambulatory Visit
Admission: RE | Admit: 2022-04-17 | Discharge: 2022-04-17 | Disposition: A | Discharge status: Home / Self Care | Payer: Medicare Other | Source: Ambulatory Visit | Attending: Internal Medicine | Admitting: Internal Medicine

## 2022-04-17 VITALS — BP 104/71 | HR 91 | Temp 97.4°F | Resp 18

## 2022-04-17 DIAGNOSIS — S61412A Laceration without foreign body of left hand, initial encounter: Secondary | ICD-10-CM | POA: Diagnosis not present

## 2022-04-17 NOTE — ED Provider Notes (Signed)
[facility]    CSN: [id number] Arrival date & time: 04/17/22  1351      History   Chief Complaint Chief Complaint  Patient presents with   Hand Problem    cut on left hand - Entered by patient    HPI Brooke Moyer is a 89 y.o. female presents for evaluation of a skin tear.  Patient is accompanied by daughter.  Patient does have Alzheimer's dementia.  Daughter states yesterday while trying to get in the car and buckle her seatbelt she believes the patient caught her nail on the back of her left hand causing a skin tear.  They ran some water over it but has been keeping it covered since then.  They state it continues to bleed and patient continues to remove dressings and pick at the area.  She is not on blood thinning medications.  She is up-to-date on her tetanus from 2019.  No other injuries or concerns at this time.  HPI  Past Medical History:  Diagnosis Date   Cancer ([place])    skin cancer on left cheek   Vertigo     Patient Active Problem List   Diagnosis Date Noted   Alzheimer's dementia without behavioral disturbance ([place]) 02/05/2021   Head injury 02/01/2021   Fall 03/12/2020   Secondary hypertension 03/12/2020   Piriformis syndrome of right side 03/12/2020   Constipation 10/03/2019   Palpitations 05/11/2019   Dysphagia 05/11/2019   Abnormal weight loss 01/04/2019   Presbycusis 11/03/2018   Advanced care planning/counseling discussion 11/03/2018   Ovarian deficiency 08/26/2016   PUD (peptic ulcer disease) 09/21/2013   Hyperlipidemia 09/21/2013   Esophageal reflux 09/21/2013   Thoracic or lumbosacral neuritis or radiculitis 03/24/2011    Past Surgical History:  Procedure Laterality Date   BREAST BIOPSY Left years ago   cyst   SKIN BIOPSY      OB History   No obstetric history on file.      Home Medications    Prior to Admission medications   Medication Sig Start Date End Date Taking? Authorizing Provider  acetaminophen (TYLENOL) 500 MG  tablet Take by mouth.   Yes [provider]  cyanocobalamin 1000 MCG tablet Take by mouth.   Yes [provider]  Multiple Vitamins-Minerals (PRESERVISION AREDS 2 PO) Take by mouth.   Yes [provider]  Propylene Glycol 0.6 % SOLN Apply to eye.   Yes [provider]  senna (SENOKOT) 8.6 MG tablet Take by mouth.   Yes [provider]  benzonatate (TESSALON) 200 MG capsule Take 1 capsule (200 mg total) by mouth 3 (three) times daily as needed for cough. 06/11/21   Laurene Footman B, PA-C  COVID-19 mRNA bivalent vaccine, Pfizer, (PFIZER COVID-19 VAC BIVALENT) injection Inject into the muscle. 12/31/20   Carlyle Basques, MD  COVID-19 mRNA Vac-TriS, Pfizer, SUSP injection Inject into the muscle. 08/02/20   Carlyle Basques, MD  COVID-19 mRNA vaccine, Pfizer, 30 MCG/0.3ML injection Inject into the muscle. 08/02/20   [provider]  cyanocobalamin 1000 MCG tablet Take by mouth.    [provider]  ipratropium (ATROVENT) 0.06 % nasal spray Place 2 sprays into both nostrils 4 (four) times daily. 06/11/21   Danton Clap, PA-C  predniSONE (STERAPRED UNI-PAK 21 TAB) 10 MG (21) TBPK tablet 6 tablets on day 1; decrease by 1 tablet daily until gone. 03/04/20   Coral Spikes, DO    Family History Family History  Problem Relation Age of  Onset   Breast cancer Sister 34   Macular degeneration Mother    Other Father        unknown medical history    Social History Social History   Tobacco Use   Smoking status: Never   Smokeless tobacco: Never  Vaping Use   Vaping Use: Never used  Substance Use Topics   Alcohol use: No   Drug use: No     Allergies   Codeine   Review of Systems Review of Systems  Skin:        Skin tear left hand     Physical Exam Triage Vital Signs ED Triage Vitals [04/17/22 1359]  Enc Vitals Group     BP 104/71     Pulse Rate 91     Resp 18     Temp (!) 97.4 F (36.3 C)     Temp Source Oral     SpO2 94 %      Weight      Height      Head Circumference      Peak Flow      Pain Score 0     Pain Loc      Pain Edu?      Excl. in [place]?    No data found.  Updated Vital Signs BP 104/71 (BP Location: Right Arm)   Pulse 91   Temp (!) 97.4 F (36.3 C) (Oral)   Resp 18   SpO2 94%   Visual Acuity Right Eye Distance:   Left Eye Distance:   Bilateral Distance:    Right Eye Near:   Left Eye Near:    Bilateral Near:     Physical Exam Vitals and nursing note reviewed.  Constitutional:      Appearance: Normal appearance.  HENT:     Head: Normocephalic and atraumatic.  Eyes:     Pupils: Pupils are equal, round, and reactive to light.  Cardiovascular:     Rate and Rhythm: Normal rate.  Pulmonary:     Effort: Pulmonary effort is normal.  Musculoskeletal:       Hands:  Skin:    General: Skin is warm and dry.  Neurological:     General: No focal deficit present.     Mental Status: She is alert and oriented to person, place, and time.  Psychiatric:        Mood and Affect: Mood normal.        Behavior: Behavior normal.      UC Treatments / Results  Labs (all labs ordered are listed, but only abnormal results are displayed) Labs Reviewed - No data to display  EKG   Radiology No results found.  Procedures Procedures (including critical care time)  Medications Ordered in UC Medications - No data to display  Initial Impression / Assessment and Plan / UC Course  I have reviewed the triage vital signs and the nursing notes.  Pertinent labs & imaging results that were available during my care of the patient were reviewed by me and considered in my medical decision making (see chart for details).     Wound cleansed by nursing staff.  Bacitracin with nonadherent dressing and Coban applied Wound care reviewed with daughter as well as signs and symptoms of infection and when to seek reevaluation Follow-up with PCP in 2 to 3 days for recheck ER precautions reviewed and  daughter verbalized understanding Final Clinical Impressions(s) / UC Diagnoses   Final diagnoses:  Skin tear of left hand  without complication, initial encounter   Discharge Instructions   None    ED Prescriptions   None    PDMP not reviewed this encounter.   Melynda Ripple, NP 04/17/22 1438

## 2022-04-17 NOTE — Discharge Instructions (Addendum)
Keep wound clean and dry.  Change dressing daily unless it becomes soiled then change as needed Monitor for signs of infection which include but are not limited to redness, swelling, warmth, drainage, fevers, chills and seek reevaluation if these occur Follow-up with PCP 2 to 3 days for recheck ER precautions reviewed

## 2022-04-17 NOTE — ED Triage Notes (Signed)
Pt daughter states pt was trying to get out of car and hit her hand on the way out yesterday afternoon. Pt now has skin tear/laceration on top of left hand. Pt daughter states it is still bleeding and having a hard time keeping bandage on hand due to pt picking at it. She does have dementia.

## 2022-11-21 ENCOUNTER — Ambulatory Visit
Admission: EM | Admit: 2022-11-21 | Discharge: 2022-11-21 | Disposition: A | Discharge status: Home / Self Care | Payer: Medicare Other | Attending: Family Medicine | Admitting: Family Medicine

## 2022-11-21 DIAGNOSIS — S61412A Laceration without foreign body of left hand, initial encounter: Secondary | ICD-10-CM | POA: Diagnosis not present

## 2022-11-21 NOTE — ED Provider Notes (Signed)
[facility]    CSN: [id number] Arrival date & time: 11/21/22  1439      History   Chief Complaint Chief Complaint  Patient presents with   Hand Injury          HPI Brooke Moyer is a 89 y.o. female.    Hand Injury Here for a cut to her left hand.  She has Alzheimer's and when she was being assisted by her family she fell backwards and sustained a laceration to the dorsum of her left hand.  It quickly stopped bleeding and they did clean it in the shower.  Last tetanus was 2019.  Past Medical History:  Diagnosis Date   Cancer ([facility])    skin cancer on left cheek   Vertigo     Patient Active Problem List   Diagnosis Date Noted   Alzheimer's dementia without behavioral disturbance ([facility]) 02/05/2021   Head injury 02/01/2021   Fall 03/12/2020   Secondary hypertension 03/12/2020   Piriformis syndrome of right side 03/12/2020   Constipation 10/03/2019   Palpitations 05/11/2019   Dysphagia 05/11/2019   Abnormal weight loss 01/04/2019   Presbycusis 11/03/2018   Advanced care planning/counseling discussion 11/03/2018   Ovarian deficiency 08/26/2016   PUD (peptic ulcer disease) 09/21/2013   Hyperlipidemia 09/21/2013   Esophageal reflux 09/21/2013   Thoracic or lumbosacral neuritis or radiculitis 03/24/2011    Past Surgical History:  Procedure Laterality Date   BREAST BIOPSY Left years ago   cyst   SKIN BIOPSY      OB History   No obstetric history on file.      Home Medications    Prior to Admission medications   Medication Sig Start Date End Date Taking? Authorizing Provider  acetaminophen (TYLENOL) 500 MG tablet Take by mouth.   Yes [provider]  cyanocobalamin 1000 MCG tablet Take by mouth.   Yes [provider]  cyanocobalamin 1000 MCG tablet Take by mouth.   Yes [provider]  Multiple Vitamins-Minerals (PRESERVISION AREDS 2 PO) Take by mouth.   Yes [provider]  Propylene Glycol 0.6 % SOLN  Apply to eye.   Yes [provider]  senna (SENOKOT) 8.6 MG tablet Take by mouth.   Yes [provider]  COVID-19 mRNA bivalent vaccine, Pfizer, (PFIZER COVID-19 VAC BIVALENT) injection Inject into the muscle. 12/31/20   Judyann Munson, MD  COVID-19 mRNA Vac-TriS, Pfizer, SUSP injection Inject into the muscle. 08/02/20   Judyann Munson, MD  COVID-19 mRNA vaccine, Pfizer, 30 MCG/0.3ML injection Inject into the muscle. 08/02/20   [provider]  ipratropium (ATROVENT) 0.06 % nasal spray Place 2 sprays into both nostrils 4 (four) times daily. 06/11/21   Shirlee Latch, PA-C    Family History Family History  Problem Relation Age of Onset   Breast cancer Sister 39   Macular degeneration Mother    Other Father        unknown medical history    Social History Social History   Tobacco Use   Smoking status: Never   Smokeless tobacco: Never  Vaping Use   Vaping status: Never Used  Substance Use Topics   Alcohol use: No   Drug use: No     Allergies   Codeine   Review of Systems Review of Systems   Physical Exam Triage Vital Signs ED Triage Vitals  Encounter Vitals Group     BP 11/21/22 1510 120/87     Systolic BP Percentile --  Diastolic BP Percentile --      Pulse Rate 11/21/22 1510 66     Resp --      Temp --      Temp src --      SpO2 11/21/22 1510 100 %     Weight 11/21/22 1509 104 lb (47.2 kg)     Height 11/21/22 1509 5\' 2"  (1.575 m)     Head Circumference --      Peak Flow --      Pain Score 11/21/22 1508 0     Pain Loc --      Pain Education --      Exclude from Growth Chart --    No data found.  Updated Vital Signs BP 120/87 (BP Location: Left Arm)   Pulse 66   Temp 98 F (36.7 C) (Oral)   Ht 5\' 2"  (1.575 m)   Wt 47.2 kg   SpO2 100%   BMI 19.02 kg/m   Visual Acuity Right Eye Distance:   Left Eye Distance:   Bilateral Distance:    Right Eye Near:   Left Eye Near:    Bilateral Near:     Physical Exam Vitals  reviewed.  Constitutional:      General: She is not in acute distress.    Appearance: She is not ill-appearing, toxic-appearing or diaphoretic.  Skin:    Coloration: Skin is not pale.     Comments: On the dorsum of her left hand there is a laceration that is 6 cm in length.  It is not bleeding at the time of exam.  Her tendons of her hand are visible but they are intact.  There is also a vein visible in the wound that is not lacerated. Distally there is good capillary refill and range of motion of the fingers is normal   Neurological:     General: No focal deficit present.     Mental Status: She is alert.  Psychiatric:        Behavior: Behavior normal.      UC Treatments / Results  Labs (all labs ordered are listed, but only abnormal results are displayed) Labs Reviewed - No data to display  EKG   Radiology No results found.  Procedures Procedures (including critical care time)  Medications Ordered in UC Medications - No data to display  Initial Impression / Assessment and Plan / UC Course  I have reviewed the triage vital signs and the nursing notes.  Pertinent labs & imaging results that were available during my care of the patient were reviewed by me and considered in my medical decision making (see chart for details).      Discussed options and decided on surgical glue to repair the wound.  Wound is cleansed with dermal skin cleanser and then under clean conditions Dermabond is used to close the wound with good results.  Tegaderm OpSite is placed over that to keep the patient from picking at the glue and to protect the wound. Final Clinical Impressions(s) / UC Diagnoses   Final diagnoses:  Laceration of left hand without foreign body, initial encounter     Discharge Instructions      We have used Dermabond surgical glue to close the laceration. It will usually stay on 5 to 7 days and should then just peel off on its own.  Ice and elevation can help how  it feels.  She can also take Tylenol as needed.        ED  Prescriptions   None    PDMP not reviewed this encounter.   Zenia Resides, MD 11/21/22 [id number]

## 2022-11-21 NOTE — ED Triage Notes (Signed)
Pt is with her daughter  Pt c/o left hand injury after fall.

## 2022-11-21 NOTE — Discharge Instructions (Signed)
We have used Dermabond surgical glue to close the laceration. It will usually stay on 5 to 7 days and should then just peel off on its own.  Ice and elevation can help how it feels.  She can also take Tylenol as needed.

## 2022-12-01 ENCOUNTER — Ambulatory Visit
Admission: EM | Admit: 2022-12-01 | Discharge: 2022-12-01 | Disposition: A | Discharge status: Home / Self Care | Payer: Medicare Other | Attending: Emergency Medicine | Admitting: Emergency Medicine

## 2022-12-01 ENCOUNTER — Other Ambulatory Visit: Payer: Self-pay

## 2022-12-01 DIAGNOSIS — Z5189 Encounter for other specified aftercare: Secondary | ICD-10-CM | POA: Diagnosis not present

## 2022-12-01 MED ORDER — MUPIROCIN 2 % EX OINT: 1.0000 | TOPICAL_OINTMENT | Freq: Every day | CUTANEOUS | 0 refills | Status: AC

## 2022-12-01 NOTE — ED Triage Notes (Signed)
Pt here for wound check of right hand. Area red ecchymotic with slight drainage. Denies fever and pain pt able to move fingers w/o diff.

## 2022-12-01 NOTE — Discharge Instructions (Signed)
Keep wound clean, use antibiotic ointment as directed, cover with nonstick Telfa dressing.  If you develop fever over 101, red streaking, worsening symptoms,etc  will need to be re evaluated at that time.

## 2022-12-01 NOTE — ED Provider Notes (Signed)
[facility]    CSN: [id number] Arrival date & time: 12/01/22  1511      History   Chief Complaint Chief Complaint  Patient presents with   Wound Check    HPI CALEN POSCH is a 89 y.o. female.   89 year old female, Ayriana Wix, presents to urgent care for evaluation of left hand wound.  Patient was seen a week ago in urgent care and treated for hand laceration with Dermabond. Washed  area off today and it looks like there was some drainage, bruising to area.  Family wanted to get wound checked out.  Patient has full range of motion no fever, no streaking,  no purulent drainage noted.  Well-healing laceration.  The history is provided by the patient and a caregiver. No language interpreter was used.    Past Medical History:  Diagnosis Date   Cancer (HCC)    skin cancer on left cheek   Vertigo     Patient Active Problem List   Diagnosis Date Noted   Encounter for wound re-check 12/01/2022   Alzheimer's dementia without behavioral disturbance (HCC) 02/05/2021   Head injury 02/01/2021   Fall 03/12/2020   Secondary hypertension 03/12/2020   Piriformis syndrome of right side 03/12/2020   Constipation 10/03/2019   Palpitations 05/11/2019   Dysphagia 05/11/2019   Abnormal weight loss 01/04/2019   Presbycusis 11/03/2018   Advanced care planning/counseling discussion 11/03/2018   Ovarian deficiency 08/26/2016   PUD (peptic ulcer disease) 09/21/2013   Hyperlipidemia 09/21/2013   Esophageal reflux 09/21/2013   Thoracic or lumbosacral neuritis or radiculitis 03/24/2011    Past Surgical History:  Procedure Laterality Date   BREAST BIOPSY Left years ago   cyst   SKIN BIOPSY      OB History   No obstetric history on file.      Home Medications    Prior to Admission medications   Medication Sig Start Date End Date Taking? Authorizing Provider  mupirocin ointment (BACTROBAN) 2 % Apply 1 Application topically daily for 5 days. Left hand 12/01/22  12/06/22 Yes Stefania Goulart, Para March, NP  acetaminophen (TYLENOL) 500 MG tablet Take by mouth.    [provider]  COVID-19 mRNA bivalent vaccine, Pfizer, (PFIZER COVID-19 VAC BIVALENT) injection Inject into the muscle. 12/31/20   Judyann Munson, MD  COVID-19 mRNA Vac-TriS, Pfizer, SUSP injection Inject into the muscle. 08/02/20   Judyann Munson, MD  COVID-19 mRNA vaccine, Pfizer, 30 MCG/0.3ML injection Inject into the muscle. 08/02/20   [provider]  cyanocobalamin 1000 MCG tablet Take by mouth.    [provider]  cyanocobalamin 1000 MCG tablet Take by mouth.    [provider]  ipratropium (ATROVENT) 0.06 % nasal spray Place 2 sprays into both nostrils 4 (four) times daily. 06/11/21   Shirlee Latch, PA-C  Multiple Vitamins-Minerals (PRESERVISION AREDS 2 PO) Take by mouth.    [provider]  Propylene Glycol 0.6 % SOLN Apply to eye.    [provider]  senna (SENOKOT) 8.6 MG tablet Take by mouth.    [provider]    Family History Family History  Problem Relation Age of Onset   Breast cancer Sister 13   Macular degeneration Mother    Other Father        unknown medical history    Social History Social History   Tobacco Use   Smoking status: Never   Smokeless tobacco: Never  Vaping Use   Vaping status: Never Used  Substance Use  Topics   Alcohol use: No   Drug use: No     Allergies   Codeine   Review of Systems Review of Systems  Constitutional:  Negative for fever.  Skin:  Positive for color change and wound.  All other systems reviewed and are negative.    Physical Exam Triage Vital Signs ED Triage Vitals  Encounter Vitals Group     BP 12/01/22 1525 132/86     Systolic BP Percentile --      Diastolic BP Percentile --      Pulse Rate 12/01/22 1525 67     Resp 12/01/22 1525 20     Temp 12/01/22 1525 97.7 F (36.5 C)     Temp src --      SpO2 12/01/22 1525 100 %     Weight --      Height --       Head Circumference --      Peak Flow --      Pain Score 12/01/22 1528 0     Pain Loc --      Pain Education --      Exclude from Growth Chart --    No data found.  Updated Vital Signs BP 132/86   Pulse 67   Temp 97.7 F (36.5 C)   Resp 20   SpO2 100%   Visual Acuity Right Eye Distance:   Left Eye Distance:   Bilateral Distance:    Right Eye Near:   Left Eye Near:    Bilateral Near:     Physical Exam Vitals and nursing note reviewed.  Constitutional:      General: She is not in acute distress.    Appearance: She is well-developed and well-groomed.  HENT:     Head: Normocephalic and atraumatic.  Eyes:     Conjunctiva/sclera: Conjunctivae normal.  Cardiovascular:     Rate and Rhythm: Normal rate and regular rhythm.     Pulses: Normal pulses.          Radial pulses are 2+ on the left side.     Heart sounds: No murmur heard. Pulmonary:     Effort: Pulmonary effort is normal. No respiratory distress.     Breath sounds: Normal breath sounds.  Abdominal:     Palpations: Abdomen is soft.     Tenderness: There is no abdominal tenderness.  Musculoskeletal:        General: No swelling.     Cervical back: Neck supple.  Skin:    General: Skin is warm and dry.     Capillary Refill: Capillary refill takes less than 2 seconds.     Findings: Ecchymosis and wound present. No erythema.     Comments: No purulent drainage, well healing granulation tissue noted  Left hand old bruising noted,no swelling  Neurological:     General: No focal deficit present.     Mental Status: She is alert and oriented to person, place, and time.     GCS: GCS eye subscore is 4. GCS verbal subscore is 5. GCS motor subscore is 6.     Cranial Nerves: No cranial nerve deficit.     Sensory: No sensory deficit.     Motor: Motor function is intact.     Comments: Good distal sensation and movement of fingers of left hand  Psychiatric:        Attention and Perception: Attention normal.        Mood and  Affect: Mood normal.  Behavior: Behavior is cooperative.      UC Treatments / Results  Labs (all labs ordered are listed, but only abnormal results are displayed) Labs Reviewed - No data to display  EKG   Radiology No results found.  Procedures Procedures (including critical care time)  Medications Ordered in UC Medications - No data to display  Initial Impression / Assessment and Plan / UC Course  I have reviewed the triage vital signs and the nursing notes.  Pertinent labs & imaging results that were available during my care of the patient were reviewed by me and considered in my medical decision making (see chart for details).     Ddx: Wound check, well-healing laceration Final Clinical Impressions(s) / UC Diagnoses   Final diagnoses:  Encounter for wound re-check     Discharge Instructions      Keep wound clean, use antibiotic ointment as directed, cover with nonstick Telfa dressing.  If you develop fever over 101, red streaking, worsening symptoms,etc  will need to be re evaluated at that time.     ED Prescriptions     Medication Sig Dispense Auth. Provider   mupirocin ointment (BACTROBAN) 2 % Apply 1 Application topically daily for 5 days. Left hand 5 g Alazae Crymes, Para March, NP      PDMP not reviewed this encounter.   Clancy Gourd, NP 12/01/22 2046

## 2023-08-27 DEATH — deceased
